# Patient Record
Sex: Female | Born: 1935 | Race: White | Hispanic: No | Marital: Married | Smoking: Never smoker
Health system: Southern US, Community
[De-identification: ages and names within clinical notes are randomized; demographics above are authoritative.]

## PROBLEM LIST (undated history)

## (undated) DIAGNOSIS — I509 Heart failure, unspecified: Secondary | ICD-10-CM

## (undated) DIAGNOSIS — E559 Vitamin D deficiency, unspecified: Secondary | ICD-10-CM

## (undated) DIAGNOSIS — I10 Essential (primary) hypertension: Secondary | ICD-10-CM

## (undated) DIAGNOSIS — M1990 Unspecified osteoarthritis, unspecified site: Secondary | ICD-10-CM

## (undated) DIAGNOSIS — K219 Gastro-esophageal reflux disease without esophagitis: Secondary | ICD-10-CM

## (undated) DIAGNOSIS — G309 Alzheimer's disease, unspecified: Secondary | ICD-10-CM

## (undated) DIAGNOSIS — F0280 Dementia in other diseases classified elsewhere without behavioral disturbance: Secondary | ICD-10-CM

## (undated) DIAGNOSIS — I639 Cerebral infarction, unspecified: Secondary | ICD-10-CM

## (undated) DIAGNOSIS — K5900 Constipation, unspecified: Secondary | ICD-10-CM

## (undated) HISTORY — PX: ABDOMINAL HYSTERECTOMY: SHX81

## (undated) HISTORY — PX: BYPASS GRAFT: SHX909

---

## 1998-09-27 ENCOUNTER — Other Ambulatory Visit: Admission: RE | Admit: 1998-09-27 | Discharge: 1998-09-27 | Payer: Self-pay | Admitting: Obstetrics and Gynecology

## 1998-12-27 ENCOUNTER — Ambulatory Visit (HOSPITAL_COMMUNITY): Admission: RE | Admit: 1998-12-27 | Discharge: 1998-12-28 | Payer: Self-pay | Admitting: Cardiovascular Disease

## 1999-01-04 ENCOUNTER — Encounter: Payer: Self-pay | Admitting: Thoracic Surgery (Cardiothoracic Vascular Surgery)

## 1999-01-06 ENCOUNTER — Inpatient Hospital Stay (HOSPITAL_COMMUNITY)
Admission: RE | Admit: 1999-01-06 | Discharge: 1999-01-12 | Payer: Self-pay | Admitting: Thoracic Surgery (Cardiothoracic Vascular Surgery)

## 1999-01-06 ENCOUNTER — Encounter: Payer: Self-pay | Admitting: Thoracic Surgery (Cardiothoracic Vascular Surgery)

## 1999-01-07 ENCOUNTER — Encounter: Payer: Self-pay | Admitting: Thoracic Surgery (Cardiothoracic Vascular Surgery)

## 1999-01-08 ENCOUNTER — Encounter: Payer: Self-pay | Admitting: Thoracic Surgery (Cardiothoracic Vascular Surgery)

## 1999-01-09 ENCOUNTER — Encounter: Payer: Self-pay | Admitting: Thoracic Surgery (Cardiothoracic Vascular Surgery)

## 1999-07-06 ENCOUNTER — Inpatient Hospital Stay (HOSPITAL_COMMUNITY): Admission: AD | Admit: 1999-07-06 | Discharge: 1999-07-08 | Payer: Self-pay | Admitting: Cardiovascular Disease

## 1999-07-06 ENCOUNTER — Encounter: Payer: Self-pay | Admitting: Cardiovascular Disease

## 2001-01-25 ENCOUNTER — Ambulatory Visit (HOSPITAL_COMMUNITY): Admission: RE | Admit: 2001-01-25 | Discharge: 2001-01-25 | Payer: Self-pay | Admitting: Family Medicine

## 2001-01-25 ENCOUNTER — Encounter: Payer: Self-pay | Admitting: Family Medicine

## 2001-02-27 ENCOUNTER — Other Ambulatory Visit: Admission: RE | Admit: 2001-02-27 | Discharge: 2001-02-27 | Payer: Self-pay | Admitting: Obstetrics and Gynecology

## 2002-03-10 ENCOUNTER — Other Ambulatory Visit: Admission: RE | Admit: 2002-03-10 | Discharge: 2002-03-10 | Payer: Self-pay | Admitting: Obstetrics and Gynecology

## 2003-08-04 ENCOUNTER — Other Ambulatory Visit: Admission: RE | Admit: 2003-08-04 | Discharge: 2003-08-04 | Payer: Self-pay | Admitting: Obstetrics and Gynecology

## 2005-11-15 ENCOUNTER — Encounter: Admission: RE | Admit: 2005-11-15 | Discharge: 2006-02-13 | Payer: Self-pay | Admitting: Family Medicine

## 2010-06-12 ENCOUNTER — Other Ambulatory Visit: Payer: Self-pay

## 2010-09-12 ENCOUNTER — Ambulatory Visit: Payer: Self-pay | Admitting: Emergency Medicine

## 2013-05-01 ENCOUNTER — Ambulatory Visit (INDEPENDENT_AMBULATORY_CARE_PROVIDER_SITE_OTHER): Payer: Medicare Other | Admitting: Podiatrist

## 2013-05-01 ENCOUNTER — Encounter: Payer: Self-pay | Admitting: Podiatrist

## 2013-05-01 VITALS — BP 100/41 | HR 59 | Resp 16 | Ht 63.0 in | Wt 150.0 lb

## 2013-05-01 DIAGNOSIS — M79609 Pain in unspecified limb: Secondary | ICD-10-CM

## 2013-05-01 DIAGNOSIS — B351 Tinea unguium: Secondary | ICD-10-CM

## 2013-05-01 NOTE — Progress Notes (Signed)
   Subjective:    Patient ID: Marissa Colon, female    DOB: [birth year], 80 y.o.   MRN: [id number]  HPI Comments: N pain  L trim toenails/corns D yrs O slowly C worse A growing out T pts nurse tried to cut toenails, corn pads     Review of Systems     Objective:   Physical Exam GENERAL APPEARANCE: Alert, conversant. Appropriately groomed. No acute distress.  VASCULAR: Pedal pulses palpable at 1/4 DP and PT bilateral.  Capillary refill time is immediate to all digits,  Proximal to distal cooling it warm to warm.   NEUROLOGIC: sensation is intact epicritically and protectively to 5.07 monofilament at 5/5 sites bilateral.  Light touch is intact bilateral, vibratory sensation intact bilateral,  MUSCULOSKELETAL: bunion deformity, flat feet, digital contractures with crowding of toes is present bilateral.  DERMATOLOGIC: toenails are thick, discolored, dystrophic and clinically mycotic 1-5 bilateral.  Hard corns present dorsal 4th toes bilateral of which her caregivers have been placing salicylic acid corn removers on these lesions.      Assessment & Plan:  Symptomatic onychomycosis Plan:  Discussed etiology, pathology, conservative vs. Surgical therapies and at this time debridement of symptomatic toenails was recommended.  Onychoreduction of symptomatic toenails was performed without iatrogenic incident.  Patient was instructed on signs and symptoms of infection and was told to call immediately should any of these arise.  Advised against salicylic acid corn treatment.  Patient's son requested no trim of the corns therefore these were left alone although the patient asked they be trimmed.

## 2013-05-01 NOTE — Progress Notes (Deleted)
Toenails, bunions, corns 4th toes bilateral, crowding of digits 1/4 dp pt, neuro ok, memory care, son doesn't want corns cut on- sal acid tx

## 2016-03-27 ENCOUNTER — Ambulatory Visit (INDEPENDENT_AMBULATORY_CARE_PROVIDER_SITE_OTHER): Payer: Medicare Other | Admitting: Podiatry

## 2016-03-27 ENCOUNTER — Encounter: Payer: Self-pay | Admitting: Podiatry

## 2016-03-27 DIAGNOSIS — M79676 Pain in unspecified toe(s): Secondary | ICD-10-CM

## 2016-03-27 DIAGNOSIS — B351 Tinea unguium: Secondary | ICD-10-CM

## 2016-03-27 NOTE — Progress Notes (Signed)
   Subjective:    Patient ID: Marissa Colon, female    DOB: October 16, 1923, 83 y.o.   MRN: [id number]  HPI's patient presents the office with chief complaint of long painful nails. She says the nails are painful when she walks and wears her shoes. Her nails have not been treated for a long period of time. No evidence of any drainage or infection noted. Resents the office today for preventative foot care services    Review of Systems     Objective:   Physical Exam GENERAL APPEARANCE: Alert, conversant. Appropriately groomed. No acute distress.  VASCULAR: Pedal pulses are  Not  palpable at  [facility].  Capillary refill time is immediate to all digits,  Cold feet noted.  NEUROLOGIC: sensation is normal to 5.07 monofilament at 5/5 sites bilateral.  Light touch is intact bilateral, Muscle strength normal.  MUSCULOSKELETAL: acceptable muscle strength, tone and stability bilateral.  Intrinsic muscluature intact bilateral.  Rectus appearance of foot and digits noted bilateral. Swelling feet/legs  B/L  DERMATOLOGIC: skin color, texture, and turgor are within normal limits.  No preulcerative lesions or ulcers  are seen, no interdigital maceration noted.  No open lesions present.   No drainage noted.  NAILS  Thick ling disfigured discolored nails  B/L           Assessment & Plan:  Onychomycosis  B/L  IE  Debride nails  RTC 3 months.   Helane GuntherGregory Mayer DPM

## 2016-06-26 ENCOUNTER — Ambulatory Visit: Payer: Medicare Other | Admitting: Podiatry

## 2016-07-13 ENCOUNTER — Ambulatory Visit: Payer: Medicare Other | Admitting: Podiatry

## 2016-08-01 ENCOUNTER — Emergency Department: Payer: Medicare Other

## 2016-08-01 ENCOUNTER — Encounter: Payer: Self-pay | Admitting: Emergency Medicine

## 2016-08-01 ENCOUNTER — Inpatient Hospital Stay
Admission: EM | Admit: 2016-08-01 | Discharge: 2016-08-04 | DRG: 086 | Disposition: A | Discharge status: Skilled Nursing Facility | Payer: Medicare Other | Attending: Internal Medicine | Admitting: Internal Medicine

## 2016-08-01 DIAGNOSIS — Z66 Do not resuscitate: Secondary | ICD-10-CM | POA: Diagnosis present

## 2016-08-01 DIAGNOSIS — S62213A Bennett's fracture, unspecified hand, initial encounter for closed fracture: Secondary | ICD-10-CM

## 2016-08-01 DIAGNOSIS — F0280 Dementia in other diseases classified elsewhere without behavioral disturbance: Secondary | ICD-10-CM | POA: Diagnosis present

## 2016-08-01 DIAGNOSIS — S62212A Bennett's fracture, left hand, initial encounter for closed fracture: Secondary | ICD-10-CM | POA: Diagnosis present

## 2016-08-01 DIAGNOSIS — Z9181 History of falling: Secondary | ICD-10-CM | POA: Diagnosis not present

## 2016-08-01 DIAGNOSIS — G309 Alzheimer's disease, unspecified: Secondary | ICD-10-CM | POA: Diagnosis present

## 2016-08-01 DIAGNOSIS — D72829 Elevated white blood cell count, unspecified: Secondary | ICD-10-CM

## 2016-08-01 DIAGNOSIS — S0990XA Unspecified injury of head, initial encounter: Secondary | ICD-10-CM | POA: Diagnosis present

## 2016-08-01 DIAGNOSIS — Z90710 Acquired absence of both cervix and uterus: Secondary | ICD-10-CM | POA: Diagnosis not present

## 2016-08-01 DIAGNOSIS — N289 Disorder of kidney and ureter, unspecified: Secondary | ICD-10-CM | POA: Diagnosis present

## 2016-08-01 DIAGNOSIS — Z7982 Long term (current) use of aspirin: Secondary | ICD-10-CM | POA: Diagnosis not present

## 2016-08-01 DIAGNOSIS — I6200 Nontraumatic subdural hemorrhage, unspecified: Secondary | ICD-10-CM | POA: Diagnosis not present

## 2016-08-01 DIAGNOSIS — I4581 Long QT syndrome: Secondary | ICD-10-CM | POA: Diagnosis present

## 2016-08-01 DIAGNOSIS — R509 Fever, unspecified: Secondary | ICD-10-CM

## 2016-08-01 DIAGNOSIS — I10 Essential (primary) hypertension: Secondary | ICD-10-CM | POA: Diagnosis present

## 2016-08-01 DIAGNOSIS — N390 Urinary tract infection, site not specified: Secondary | ICD-10-CM | POA: Diagnosis present

## 2016-08-01 DIAGNOSIS — R739 Hyperglycemia, unspecified: Secondary | ICD-10-CM | POA: Diagnosis present

## 2016-08-01 DIAGNOSIS — W1830XA Fall on same level, unspecified, initial encounter: Secondary | ICD-10-CM | POA: Diagnosis present

## 2016-08-01 DIAGNOSIS — D649 Anemia, unspecified: Secondary | ICD-10-CM

## 2016-08-01 DIAGNOSIS — S065X9A Traumatic subdural hemorrhage with loss of consciousness of unspecified duration, initial encounter: Secondary | ICD-10-CM

## 2016-08-01 DIAGNOSIS — E559 Vitamin D deficiency, unspecified: Secondary | ICD-10-CM | POA: Diagnosis present

## 2016-08-01 DIAGNOSIS — Z8673 Personal history of transient ischemic attack (TIA), and cerebral infarction without residual deficits: Secondary | ICD-10-CM | POA: Diagnosis not present

## 2016-08-01 DIAGNOSIS — S065X1A Traumatic subdural hemorrhage with loss of consciousness of 30 minutes or less, initial encounter: Secondary | ICD-10-CM | POA: Diagnosis present

## 2016-08-01 DIAGNOSIS — K5900 Constipation, unspecified: Secondary | ICD-10-CM | POA: Diagnosis present

## 2016-08-01 DIAGNOSIS — Z7902 Long term (current) use of antithrombotics/antiplatelets: Secondary | ICD-10-CM

## 2016-08-01 DIAGNOSIS — D62 Acute posthemorrhagic anemia: Secondary | ICD-10-CM | POA: Diagnosis present

## 2016-08-01 DIAGNOSIS — I629 Nontraumatic intracranial hemorrhage, unspecified: Secondary | ICD-10-CM | POA: Diagnosis not present

## 2016-08-01 DIAGNOSIS — K219 Gastro-esophageal reflux disease without esophagitis: Secondary | ICD-10-CM | POA: Diagnosis present

## 2016-08-01 DIAGNOSIS — S065XAA Traumatic subdural hemorrhage with loss of consciousness status unknown, initial encounter: Secondary | ICD-10-CM

## 2016-08-01 DIAGNOSIS — S0231XA Fracture of orbital floor, right side, initial encounter for closed fracture: Secondary | ICD-10-CM | POA: Diagnosis present

## 2016-08-01 HISTORY — DX: Vitamin D deficiency, unspecified: E55.9

## 2016-08-01 HISTORY — DX: Alzheimer's disease, unspecified: G30.9

## 2016-08-01 HISTORY — DX: Essential (primary) hypertension: I10

## 2016-08-01 HISTORY — DX: Heart failure, unspecified: I50.9

## 2016-08-01 HISTORY — DX: Gastro-esophageal reflux disease without esophagitis: K21.9

## 2016-08-01 HISTORY — DX: Constipation, unspecified: K59.00

## 2016-08-01 HISTORY — DX: Cerebral infarction, unspecified: I63.9

## 2016-08-01 HISTORY — DX: Unspecified osteoarthritis, unspecified site: M19.90

## 2016-08-01 HISTORY — DX: Dementia in other diseases classified elsewhere, unspecified severity, without behavioral disturbance, psychotic disturbance, mood disturbance, and anxiety: F02.80

## 2016-08-01 LAB — BASIC METABOLIC PANEL
Anion gap: 10 (ref 5–15)
BUN: 18 mg/dL (ref 6–20)
CHLORIDE: 101 mmol/L (ref 101–111)
CO2: 27 mmol/L (ref 22–32)
CREATININE: 1.04 mg/dL — AB (ref 0.44–1.00)
Calcium: 9.6 mg/dL (ref 8.9–10.3)
GFR calc Af Amer: 53 mL/min — ABNORMAL LOW (ref >60)
GFR calc non Af Amer: 46 mL/min — ABNORMAL LOW (ref >60)
GLUCOSE: 146 mg/dL — AB (ref 65–99)
Potassium: 3.5 mmol/L (ref 3.5–5.1)
Sodium: 138 mmol/L (ref 135–145)

## 2016-08-01 LAB — CBC WITH DIFFERENTIAL/PLATELET
Basophils Absolute: 0 10*3/uL (ref 0–0.1)
Basophils Relative: 0 %
Eosinophils Absolute: 0 10*3/uL (ref 0–0.7)
Eosinophils Relative: 0 %
HEMATOCRIT: 38.7 % (ref 35.0–47.0)
HEMOGLOBIN: 12.8 g/dL (ref 12.0–16.0)
LYMPHS ABS: 0.8 10*3/uL — AB (ref 1.0–3.6)
Lymphocytes Relative: 5 %
MCH: 31.2 pg (ref 26.0–34.0)
MCHC: 33.2 g/dL (ref 32.0–36.0)
MCV: 94.1 fL (ref 80.0–100.0)
MONOS PCT: 5 %
Monocytes Absolute: 0.9 10*3/uL (ref 0.2–0.9)
NEUTROS ABS: 14.9 10*3/uL — AB (ref 1.4–6.5)
NEUTROS PCT: 90 %
Platelets: 213 10*3/uL (ref 150–440)
RBC: 4.11 MIL/uL (ref 3.80–5.20)
RDW: 14.1 % (ref 11.5–14.5)
WBC: 16.6 10*3/uL — ABNORMAL HIGH (ref 3.6–11.0)

## 2016-08-01 LAB — TROPONIN I: Troponin I: <0.03 ng/mL (ref <0.03)

## 2016-08-01 LAB — ABO/RH: ABO/RH(D): O POS

## 2016-08-01 LAB — MRSA PCR SCREENING: MRSA BY PCR: NEGATIVE

## 2016-08-01 MED ORDER — LISINOPRIL 10 MG PO TABS
10.0000 mg | ORAL_TABLET | Freq: Every day | ORAL | Status: DC
  Administered 2016-08-01 – 2016-08-03 (×3): 10 mg via ORAL
  Filled 2016-08-01 (×4): qty 1

## 2016-08-01 MED ORDER — VITAMIN D (ERGOCALCIFEROL) 1.25 MG (50000 UNIT) PO CAPS
50000.0000 [IU] | ORAL_CAPSULE | ORAL | Status: DC
  Administered 2016-08-04: 10:00:00 50000 [IU] via ORAL
  Filled 2016-08-01: qty 1

## 2016-08-01 MED ORDER — NITROGLYCERIN 0.4 MG SL SUBL: 0.4000 mg | SUBLINGUAL_TABLET | SUBLINGUAL | Status: DC | PRN

## 2016-08-01 MED ORDER — DONEPEZIL HCL 5 MG PO TABS
10.0000 mg | ORAL_TABLET | Freq: Every day | ORAL | Status: DC
  Administered 2016-08-02 – 2016-08-04 (×3): 10 mg via ORAL
  Filled 2016-08-01 (×3): qty 2

## 2016-08-01 MED ORDER — METOPROLOL SUCCINATE ER 50 MG PO TB24
50.0000 mg | ORAL_TABLET | Freq: Every day | ORAL | Status: DC
  Administered 2016-08-02 – 2016-08-04 (×3): 50 mg via ORAL
  Filled 2016-08-01 (×3): qty 1

## 2016-08-01 MED ORDER — POLYETHYLENE GLYCOL 3350 17 G PO PACK
17.0000 g | PACK | Freq: Every day | ORAL | Status: DC | PRN
  Filled 2016-08-01: qty 1

## 2016-08-01 MED ORDER — FAMOTIDINE 20 MG PO TABS
20.0000 mg | ORAL_TABLET | Freq: Every day | ORAL | Status: DC
  Administered 2016-08-02 – 2016-08-04 (×3): 20 mg via ORAL
  Filled 2016-08-01 (×3): qty 1

## 2016-08-01 MED ORDER — MEMANTINE HCL ER 14 MG PO CP24
14.0000 mg | ORAL_CAPSULE | Freq: Every day | ORAL | Status: DC
  Administered 2016-08-02 – 2016-08-04 (×3): 14 mg via ORAL
  Filled 2016-08-01 (×3): qty 1

## 2016-08-01 MED ORDER — LACTASE 3000 UNITS PO TABS
6000.0000 [IU] | ORAL_TABLET | Freq: Three times a day (TID) | ORAL | Status: DC
  Administered 2016-08-02 – 2016-08-04 (×7): 6000 [IU] via ORAL
  Filled 2016-08-01 (×10): qty 2

## 2016-08-01 MED ORDER — ONDANSETRON HCL 4 MG PO TABS
4.0000 mg | ORAL_TABLET | Freq: Four times a day (QID) | ORAL | Status: DC | PRN
  Filled 2016-08-01: qty 1

## 2016-08-01 MED ORDER — SODIUM CHLORIDE 0.9 % IV SOLN
0.3000 ug/kg | Freq: Once | INTRAVENOUS | Status: AC
  Administered 2016-08-01: 18.4 ug via INTRAVENOUS
  Filled 2016-08-01: qty 4.6

## 2016-08-01 MED ORDER — AMLODIPINE BESYLATE 5 MG PO TABS: 5.0000 mg | ORAL_TABLET | Freq: Every day | ORAL | Status: DC

## 2016-08-01 MED ORDER — ONDANSETRON HCL 4 MG/2ML IJ SOLN
4.0000 mg | Freq: Four times a day (QID) | INTRAMUSCULAR | Status: DC | PRN
  Filled 2016-08-01: qty 2

## 2016-08-01 MED ORDER — ADULT MULTIVITAMIN W/MINERALS CH
1.0000 | ORAL_TABLET | Freq: Every day | ORAL | Status: DC
  Administered 2016-08-02 – 2016-08-04 (×3): 1 via ORAL
  Filled 2016-08-01 (×3): qty 1

## 2016-08-01 MED ORDER — CITALOPRAM HYDROBROMIDE 20 MG PO TABS
40.0000 mg | ORAL_TABLET | Freq: Every day | ORAL | Status: DC
  Administered 2016-08-02 – 2016-08-04 (×3): 40 mg via ORAL
  Filled 2016-08-01 (×3): qty 2

## 2016-08-01 MED ORDER — VITAMIN C 500 MG PO TABS: 500.0000 mg | ORAL_TABLET | Freq: Every day | ORAL | Status: DC

## 2016-08-01 MED ORDER — ACETAMINOPHEN 500 MG PO TABS
500.0000 mg | ORAL_TABLET | Freq: Four times a day (QID) | ORAL | Status: DC | PRN
  Administered 2016-08-02: 20:00:00 500 mg via ORAL
  Filled 2016-08-01 (×2): qty 1

## 2016-08-01 NOTE — ED Notes (Signed)
Ice applied to right eye.

## 2016-08-01 NOTE — ED Notes (Signed)
Patient transported to CT 

## 2016-08-01 NOTE — Progress Notes (Signed)
Family Meeting Note  Advance Directive:yes  Today a meeting took place with the Patient's son in the patient's room  Patient is unable to participate due NW:GNFAOZto:Lacked capacity Alzheimer's dementia   The following clinical team members were present during this meeting:MD  The following were discussed:Patient's diagnosis: Plan of care discussed with the patient's son at bedside , Patient's progosis: Unable to determine and Goals for treatment: Monitor patient closely, son might consider palliative care if patient is not clinically improving  Additional follow-up to be provided: Hospitalist and neurosurgery  Time spent during discussion:19 min  Marissa Colon, Deanna ArtisAruna, MD

## 2016-08-01 NOTE — Consult Note (Signed)
Neurosurgery-New Consultation Evaluation 08/01/2016 Marissa Colon [id number]  Identifying Statement: Marissa Colon is a 83 y.o. female from [place] [address] with fall and subdural hematoma  Physician Requesting Consultation: Willy Eddy, MD - Emergency Department  History of Present Illness: Marissa Colon is admitted from her living facility after fall and injury to head. She was seen to have bruising to the right side of her face. Per son, she lives at facility due to history of Alzheimer's and has had some mechanical falls. She is on Aspirin and Plavix due to history of stroke, previously on Coumadin. She was brought alert to the ED and had no major complaints. There is unknown LOC. CT of the head in the ED revealed an acute right temporal SDH and some small amount of SAH/IVH.   She has an active DNAR and son is Management consultant.   Past Medical History:  Past Medical History:  Diagnosis Date  . Alzheimer's disease   . Alzheimer's disease   . Avitaminosis D   . CN (constipation)   . Esophageal reflux   . Essential hypertension, malignant   . Heart failure, unspecified (HCC)   . Senile arthritis   . 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 Marital status: Married    Spouse name: N/A  . Number of children: N/A  . Years of education: N/A   Occupational History  . Not on file.   Social History Main Topics  . Smoking status: Never Smoker  . Smokeless tobacco: Never Used  . Alcohol use No  . Drug use: No  . Sexual activity: Not on file   Other Topics Concern  . Not on file   Social History Narrative  . No narrative on file     Family History: History reviewed. No pertinent family history.  Review of Systems:  Review of Systems - General ROS: Negative Psychological ROS: Negative Ophthalmic ROS: Negative ENT ROS: Positive for right orbital bruising/swelling Hematological and Lymphatic ROS: Negative   Endocrine ROS: Negative Respiratory ROS: Negative Cardiovascular ROS: Negative Gastrointestinal ROS: Negative Genito-Urinary ROS: Negative Musculoskeletal ROS: Negative Neurological ROS: Negative for seizure, headache Dermatological ROS: Negative  Physical Exam: BP (!) 152/66 (BP Location: Right Arm)   Pulse 79   Temp 99.7 F (37.6 C) (Oral)   Resp 18   Ht 5\' 2"  (1.575 m)   Wt 68.5 kg (151 lb)   LMP  (LMP Unknown)   SpO2 90%   BMI 27.62 kg/m  Body mass index is 27.62 kg/m. Body surface area is 1.73 meters squared. General appearance: Alert, cooperative Head: Normocephalic, There is bruising noted around right orbit that results in swelling and closure of eye Eyes: Normal, EOM intact in left eye, unable to test right eye Oropharynx: Moist without lesions Ext: No edema in LE bilaterally  Neurologic exam:  Mental status: alertness: alert, affect: appears comfortable Speech: fluent and clear Cranial nerves:  II: unable to test visual Masterson given mental status III/IV/VI: extra-ocular motions intact in left eye V/VII:no evidence of facial droop or weakness  VIII: hearing normal XII: tongue strength symmetric  Motor:strength symmetric 5/5, normal muscle mass and tone in all extremities Sensory: intact to light touch in all extremities Gait: not tested due to injury  Laboratory: Results for orders placed or performed during the hospital encounter of 08/01/16  MRSA PCR Screening  Result Value Ref Range   MRSA by PCR NEGATIVE NEGATIVE  CBC with Differential/Platelet  Result Value Ref Range   WBC 16.6 (H) 3.6 - 11.0 K/uL   RBC 4.11 3.80 - 5.20 MIL/uL   Hemoglobin 12.8 12.0 - 16.0 g/dL   HCT 96.038.7 45.435.0 - 09.847.0 %   MCV 94.1 80.0 - 100.0 fL   MCH 31.2 26.0 - 34.0 pg   MCHC 33.2 32.0 - 36.0 g/dL   RDW 11.914.1 14.711.5 - 82.914.5 %   Platelets 213 150 - 440 K/uL   Neutrophils Relative % 90 %   Neutro Abs 14.9 (H) 1.4 - 6.5 K/uL   Lymphocytes Relative 5 %   Lymphs Abs 0.8 (L) 1.0 -  3.6 K/uL   Monocytes Relative 5 %   Monocytes Absolute 0.9 0.2 - 0.9 K/uL   Eosinophils Relative 0 %   Eosinophils Absolute 0.0 0 - 0.7 K/uL   Basophils Relative 0 %   Basophils Absolute 0.0 0 - 0.1 K/uL  Basic metabolic panel  Result Value Ref Range   Sodium 138 135 - 145 mmol/L   Potassium 3.5 3.5 - 5.1 mmol/L   Chloride 101 101 - 111 mmol/L   CO2 27 22 - 32 mmol/L   Glucose, Bld 146 (H) 65 - 99 mg/dL   BUN 18 6 - 20 mg/dL   Creatinine, Ser 5.621.04 (H) 0.44 - 1.00 mg/dL   Calcium 9.6 8.9 - 13.010.3 mg/dL   GFR calc non Af Amer 46 (L) >60 mL/min   GFR calc Af Amer 53 (L) >60 mL/min   Anion gap 10 5 - 15  Troponin I  Result Value Ref Range   Troponin I <0.03 <0.03 ng/mL  Prepare pheresed platelets  Result Value Ref Range   Unit Number [id number]    Blood Component Type PLTP LR2 PAS    Unit division 00    Status of Unit ISSUED    Transfusion Status OK TO TRANSFUSE   ABO/Rh  Result Value Ref Range   ABO/RH(D) O POS   BPAM Platelet Pheresis  Result Value Ref Range   ISSUE DATE / TIME [id number]    Blood Product Unit Number [id number]    PRODUCT CODE [id number]    Unit Type and Rh 5100    Blood Product Expiration Date [id number]    I personally reviewed labs  Imaging:  CT head: There is an acute right temporal extra-axial hemorrhage approximately 7mm in thickness. Given age appropriate atrophy, there is no mass effect. There is small amount of IVH and SAH noted. There is no overlying fracture seen.    Impression/Plan:   Marissa Colon is admitted after having sustained an acute right temporal SDH in a fall. Given her ASA and Plavix use, platelet transfusion is recommended for reversal. The size is small but risk of propagation exists and discussion wa shad with son regarding her DNAR status. We have discussed that medical management of this is appropriate as her age and health status would make any surgical intervention a high risk procedure.     1.  Diagnosis: Acute  right temporal SDH  2.  Plan - Recommend platelet transfusion for ASA use - Can repeat scan in 6-8 hours to view progression - No neurosurgical intervention given co-morbidities and DNAR status, confirmed with son

## 2016-08-01 NOTE — H&P (Addendum)
[facility] [place] at [facility]   PATIENT NAME: Marissa Colon    MR#:  [id number]  DATE OF BIRTH:  11-21-1923  DATE OF ADMISSION:  08/01/2016  PRIMARY CARE PHYSICIAN: Patient, No Pcp Per   REQUESTING/REFERRING PHYSICIAN: Dr. Roxan Hockey  CHIEF COMPLAINT:   Fall HISTORY OF PRESENT ILLNESS:  Marissa Colon  is a 83 y.o. female with a known history of Alzheimer's disease, essential hypertension and other medical problems  is brought into the is brought into the ED after she sustained a fall. Patient had one episode of nausea. Patient hit her head and subsequently she has lost consciousness. In the ED she has right-sided black eye . CT head has revealed intracranial bleed with no midline shift . Patient's son has requested to admit the patient here as she is DO NOT RESUSCITATE and not considering any surgery   ED physician has discussed with neurosurgery. Patient has received 1 dose of DDAVP  PAST MEDICAL HISTORY:   Past Medical History:  Diagnosis Date  . Alzheimer's disease   . Alzheimer's disease   . Avitaminosis D   . CN (constipation)   . Esophageal reflux   . Essential hypertension, malignant   . Heart failure, unspecified (HCC)   . Senile arthritis   . 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 ABDOMINAL HYSTERECTOMY    . BYPASS GRAFT      SOCIAL HISTORY:   Social History  Substance Use Topics  . Smoking status: Never Smoker  . Smokeless tobacco: Never Used  . Alcohol use No    FAMILY HISTORY:  History reviewed. No pertinent family history.  DRUG ALLERGIES:  No Known Allergies  REVIEW OF SYSTEMS:  Review of system unobtainable  MEDICATIONS AT HOME:   Prior to Admission medications   Medication Sig Start Date End Date Taking? Authorizing Provider  acetaminophen (TYLENOL) 500 MG tablet Take 500 mg by mouth every 6 (six) hours as needed.   Yes [provider]  amLODipine (NORVASC) 5 MG tablet Take 5 mg by mouth daily.   Yes [provider]  aspirin 81 MG tablet Take 81 mg by mouth daily.   Yes [provider]  bismuth subsalicylate (KAOPECTATE) 262 MG/15ML suspension Take 30 mLs by mouth every 4 (four) hours as needed.   Yes [provider]  citalopram (CELEXA) 40 MG tablet Take 40 mg by mouth daily.   Yes [provider]  clopidogrel (PLAVIX) 75 MG tablet Take 75 mg by mouth daily with breakfast.   Yes [provider]  CRANBERRY PO Take 1 tablet by mouth 2 (two) times daily.   Yes [provider]  donepezil (ARICEPT) 10 MG tablet Take 10 mg by mouth daily.    Yes [provider]  ergocalciferol (VITAMIN D2) 50000 units capsule Take 50,000 Units by mouth once a week.   Yes [provider]  lactase (LACTAID) 3000 units tablet Take 6,000 Units by mouth 3 (three) times daily with meals.   Yes [provider]  lisinopril (PRINIVIL,ZESTRIL) 10 MG tablet Take 10 mg by mouth at bedtime.   Yes [provider]  memantine (NAMENDA XR) 14 MG CP24 24 hr capsule Take 14 mg by mouth daily.   Yes [provider]  metoprolol succinate (TOPROL-XL) 50 MG 24 hr tablet Take 50 mg by mouth daily. Take with or immediately following a meal.  Yes [provider]  Multiple Vitamin (MULTIVITAMIN WITH MINERALS) TABS tablet Take 1 tablet by mouth daily.   Yes [provider]  naproxen (NAPROSYN) 500 MG tablet Take 500 mg by mouth 2 (two) times daily as needed.   Yes [provider]  nitroGLYCERIN (NITROSTAT) 0.4 MG SL tablet Place 0.4 mg under the tongue every 5 (five) minutes as needed for chest pain.   Yes [provider]  polyethylene glycol (MIRALAX / GLYCOLAX) packet Take 17 g by mouth daily as needed.    Yes [provider]  ranitidine (ZANTAC) 150 MG tablet Take 150 mg by mouth daily.   Yes [provider]   vitamin C (ASCORBIC ACID) 500 MG tablet Take 500 mg by mouth daily.   Yes [provider]      VITAL SIGNS:  Blood pressure (!) 120/57, pulse 79, temperature 98.2 F (36.8 C), temperature source Oral, resp. rate 17, height 5\' 2"  (1.575 m), weight 61.2 kg (135 lb), SpO2 96 %.  PHYSICAL EXAMINATION:  GENERAL:  83 y.o.-year-old patient lying in the bed with no acute distress.  EYES: Left eye Pupils equal, round, reactive to light and accommodation. No scleral icterus.  Rt black eye HEENT: Head atraumatic, Normocephalic. Oropharynx and nasopharynx clear.  NECK:  Supple, no jugular venous distention. No thyroid enlargement, no tenderness.  LUNGS: Normal breath sounds bilaterally, no wheezing, rales,rhonchi or crepitation. No use of accessory muscles of respiration.  CARDIOVASCULAR: S1, S2 normal. No murmurs, rubs, or gallops.  ABDOMEN: Soft, nontender, nondistended. Bowel sounds present. No organomegaly or mass.  EXTREMITIES: No pedal edema, cyanosis, or clubbing.  NEUROLOGIC: Patient is chronically demented   PSYCHIATRIC: The patient is alert and demented  SKIN: No obvious rash, lesion, or ulcer.   LABORATORY PANEL:   CBC  Recent Labs Lab 08/01/16 1306  WBC 16.6*  HGB 12.8  HCT 38.7  PLT 213   ------------------------------------------------------------------------------------------------------------------  Chemistries   Recent Labs Lab 08/01/16 1306  NA 138  K 3.5  CL 101  CO2 27  GLUCOSE 146*  BUN 18  CREATININE 1.04*  CALCIUM 9.6   ------------------------------------------------------------------------------------------------------------------  Cardiac Enzymes  Recent Labs Lab 08/01/16 1306  TROPONINI <0.03   ------------------------------------------------------------------------------------------------------------------  RADIOLOGY:  Dg Chest 2 View  Result Date: 08/01/2016 CLINICAL DATA:  Fall. EXAM: CHEST  2 VIEW COMPARISON:  Chest x-ray  07/06/1999 report . FINDINGS: Prior CABG. Cardiomegaly with mild bilateral interstitial prominence consistent with CHF. No pleural effusion or pneumothorax. Degenerative changes and osteopenia thoracic spine. Mild lower thoracic vertebral body compression fractures, age undetermined. IMPRESSION: Prior CABG. Cardiomegaly with diffuse mild bilateral interstitial prominence consistent with mild CHF. Electronically Signed   By: Maisie Fus  Register   On: 08/01/2016 13:07   Ct Head Wo Contrast  Result Date: 08/01/2016 CLINICAL DATA:  Pt to ED via EMS from [facility] for a fall this morning, EMS states per facility it was a mechanical witnessed fall and then had a vagal episode on the toilet. EXAM: CT HEAD WITHOUT CONTRAST CT MAXILLOFACIAL WITHOUT CONTRAST TECHNIQUE: Multidetector CT imaging of the head and maxillofacial structures were performed using the standard protocol without intravenous contrast. Multiplanar CT image reconstructions of the maxillofacial structures were also generated. COMPARISON:  None FINDINGS: CT HEAD FINDINGS Brain: There is intracranial hemorrhage. There is a small right temporal subdural hematoma measuring 6.5 mm in thickness. Small foci of hemorrhage are seen along the cortical margins of the frontal lobes anteriorly, consistent with parenchymal contusions. There is intraventricular hemorrhage.  Hemorrhage is seen layering in the dependent portions of the lateral ventricle occipital horns and right temporal horn. There is hemorrhage within the right lateral ventricle anterior horn hand adjacent to the right septum pellucidum. A tiny focus of hemorrhage is seen along the anterior interhemispheric fissure. The ventricles are enlarged along the sulci reflecting moderate generalized atrophy. No convincing hydrocephalus. There is no old lacune infarct in the left basal ganglia. Patchy white matter hypoattenuation is noted bilaterally consistent with moderate chronic microvascular ischemic change.  There is no evidence of an ischemic infarct. Vascular: No hyperdense vessel or unexpected calcification. Skull: No skull fracture.  No skull lesion. Other: Right periorbital soft tissue hemorrhage described below. CT MAXILLOFACIAL FINDINGS Osseous: Subtle nondisplaced fracture of the lateral right orbital wall, which could be acute or old. Mild depression of the floor of the right orbit which may reflect another acute fracture but could be chronic. No other evidence of a fracture. No bone lesions. Orbits: There is significant right preseptal periorbital hemorrhage extending to the right cheek and right lower forehead and temple region. No postseptal orbital hemorrhage. Globe is intact. Left globe and orbit are unremarkable. Sinuses: Clear sinuses, mastoid air cells and middle ear cavities. Soft tissues: No soft tissue masses or adenopathy. IMPRESSION: 1. Acute intracranial hemorrhage. There are small foci of cortical hemorrhage reflecting contusions along the anterior frontal lobes. There is a small right temporal subdural hematoma and there is a small amount of intraventricular hemorrhage. No mass effect or midline shift. 2. Nondisplaced fracture of the right lateral orbital wall and mildly depressed fracture of right orbital floor, both of which could be acute or chronic. A chronic etiology is supported given the lack of adjacent edema and lack of sinus fluid. No other evidence of a fracture. 3. Significant right preseptal periorbital hemorrhage extending to the right inferior forehead, right temple region right cheek. No evidence of injury to the right globe or postseptal orbit. Critical Value/emergent results were called by telephone at the time of interpretation on 08/01/2016 at 12:56 pm to Dr. Ernestina Columbia, who verbally acknowledged these results. Electronically Signed   By: Amie Portland M.D.   On: 08/01/2016 12:53   Ct Cervical Spine Wo Contrast  Result Date: 08/01/2016 CLINICAL DATA:  Fall EXAM: CT CERVICAL  SPINE WITHOUT CONTRAST TECHNIQUE: Multidetector CT imaging of the cervical spine was performed without intravenous contrast. Multiplanar CT image reconstructions were also generated. COMPARISON:  None. FINDINGS: Alignment: No static subluxation. Facets are aligned. Occipital condyles and the lateral masses of C1 and C2 are normally approximated. Skull base and vertebrae: No acute fracture. Soft tissues and spinal canal: No prevertebral fluid or swelling. No visible canal hematoma. Disc levels: There is multilevel severe facet hypertrophy and disc degeneration with uncovertebral hypertrophy. The left C3-C4 facets are fused. There is severe stenosis of the right C6-7 neural foramen. No bony spinal canal stenosis. Upper chest: No pneumothorax, pulmonary nodule or pleural effusion. There is aortic atherosclerosis. Other: Normal visualized paraspinal cervical soft tissues. IMPRESSION: 1. No acute fracture or static subluxation of the cervical spine. 2. 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 3.  Aortic Atherosclerosis (ICD10-I70.0). Electronically Signed   By: Deatra Robinson M.D.   On: 08/01/2016 13:46   Dg Hand Complete Left  Result Date: 08/01/2016 CLINICAL DATA:  Larey Seat and injured left hand. EXAM: LEFT HAND - COMPLETE 3+ VIEW COMPARISON:  None. FINDINGS: Moderate to advanced degenerative changes involving the DIP and PIP joints of the fingers.  The MCP joints are maintained. The radiocarpal and intercarpal joint spaces are maintained. No wrist fracture is identified. There is a fracture involving the base of the thumb with probable intra-articular component at the [facility]. No associated dislocation. IMPRESSION: Comminuted intra-articular fracture involving the base of the thumb Willeen Cass(Bennett fracture). No associated dislocation. Degenerative changes but no other fractures. Electronically Signed   By: Rudie MeyerP.  Gallerani M.D.   On: 08/01/2016 13:10   Ct Maxillofacial Wo Contrast  Result  Date: 08/01/2016 CLINICAL DATA:  [facility] ED [facility] for [facility] this morning, EMS states per facility it was a mechanical witnessed fall and then had a vagal episode on the toilet. EXAM: CT HEAD WITHOUT CONTRAST CT MAXILLOFACIAL WITHOUT CONTRAST TECHNIQUE: Multidetector CT imaging of the head and maxillofacial structures were performed using the standard protocol without intravenous contrast. Multiplanar CT image reconstructions of the maxillofacial structures were also generated. COMPARISON:  None FINDINGS: CT HEAD FINDINGS Brain: There is intracranial hemorrhage. There is a small right temporal subdural hematoma measuring 6.5 mm in thickness. Small foci of hemorrhage are seen along the cortical margins of the frontal lobes anteriorly, consistent with parenchymal contusions. There is intraventricular hemorrhage. Hemorrhage is seen layering in the dependent portions of the lateral ventricle occipital horns and right temporal horn. There is hemorrhage within the right lateral ventricle anterior horn hand adjacent to the right septum pellucidum. A tiny focus of hemorrhage is seen along the anterior interhemispheric fissure. The ventricles are enlarged along the sulci reflecting moderate generalized atrophy. No convincing hydrocephalus. There is no old lacune infarct in the left basal ganglia. Patchy white matter hypoattenuation is noted bilaterally consistent with moderate chronic microvascular ischemic change. There is no evidence of an ischemic infarct. Vascular: No hyperdense vessel or unexpected calcification. Skull: No skull fracture.  No skull lesion. Other: Right periorbital soft tissue hemorrhage described below. CT MAXILLOFACIAL FINDINGS Osseous: Subtle nondisplaced fracture of the lateral right orbital wall, which could be acute or old. Mild depression of the floor of the right orbit which may reflect another acute fracture but could be chronic. No other evidence of a fracture. No bone lesions.  Orbits: There is significant right preseptal periorbital hemorrhage extending to the right cheek and right lower forehead and temple region. No postseptal orbital hemorrhage. Globe is intact. Left globe and orbit are unremarkable. Sinuses: Clear sinuses, mastoid air cells and middle ear cavities. Soft tissues: No soft tissue masses or adenopathy. IMPRESSION: 1. Acute intracranial hemorrhage. There are small foci of cortical hemorrhage reflecting contusions along the anterior frontal lobes. There is a small right temporal subdural hematoma and there is a small amount of intraventricular hemorrhage. No mass effect or midline shift. 2. Nondisplaced fracture of the right lateral orbital wall and mildly depressed fracture of right orbital floor, both of which could be acute or chronic. A chronic etiology is supported given the lack of adjacent edema and lack of sinus fluid. No other evidence of a fracture. 3. Significant right preseptal periorbital hemorrhage extending to the right inferior forehead, right temple region right cheek. No evidence of injury to the right globe or postseptal orbit. Critical Value/emergent results were called by telephone at the time of interpretation on 08/01/2016 at 12:56 pm to Dr. Ernestina Columbiaifenberk, who verbally acknowledged these results. Electronically Signed   By: Amie Portlandavid  Ormond M.D.   On: 08/01/2016 12:53    EKG:   Orders placed or performed during the hospital encounter of 08/01/16  . EKG 12-Lead  .  EKG 12-Lead    IMPRESSION AND PLAN:   Marissa Colon  is a 83 y.o. female with a known history of Alzheimer's disease, essential hypertension and other medical problems  is brought into the is brought into the ED after she sustained a fall. Patient had one episode of nausea. Patient hit her head and subsequently she has lost consciousness. In the ED she has right-sided black eye . CT head has revealed intracranial bleed with no midline shift . Patient's son has requested to admit the  patient here as she is DO NOT RESUSCITATE and not considering any surgery     #acute intracranial hemorrhage with no midline shift   admit to MedSurg unit she is DO NOT RESUSCITATE Neurosurgery consulted, follow-up with Dr. Adriana Simas 1 unit of platelet transfusion ordered Symptomatic treatment Antinausea medication as needed, supportive treatment Pain management 1 dose of desmopressin was given by the ED physician Hold aspirin and Plavix can other blood thinners  #Right lateral orbital wall fracture nondisplaced Pain management as needed  #Chronic history of Alzheimer's dementia Continue home medications Aricept  #Essential hypertension continue home medication lisinopril, beta blockers  All the records are reviewed and case discussed with ED provider. Management plans discussed with the patient, family and they are in agreement.  CODE STATUS: DNR , son Jonny Ruiz Theall is the healthcare power of attorney   TOTAL critical care TIME TAKING CARE OF THIS PATIENT: 43 minutes.   Note: This dictation was prepared with Dragon dictation along with smaller phrase technology. Any transcriptional errors that result from this process are unintentional.  Ramonita Lab M.D on 08/01/2016 at 4:16 PM  Between 7am to 6pm - Pager - [phone]  After 6pm go to [email or web address] - password [email or web address]  [facility]  Office  [phone]  CC: Primary care physician; Patient, No Pcp Per

## 2016-08-01 NOTE — ED Triage Notes (Signed)
[facility] this [facility], EMS states per facility it was a mechanical witnessed fall and then had a vagal episode on the toilet.  EMS vitals 113/76 BP, 75HR, 94% 4L [place], DNR with patient.  Patient has bruise to right eye, denies any pain.

## 2016-08-01 NOTE — ED Provider Notes (Signed)
[facility] Emergency Department Provider Note    First MD Initiated Contact with Patient 08/01/16 1210     (approximate)  I have reviewed the triage vital signs and the nursing notes.   HISTORY  Chief Complaint Fall  Level V Caveat:  Dementia, TBI  HPI Marissa Colon is a 83 y.o. female who presents after mechanical fall this morning. Patient has a history of Alzheimer's dementia as well as CVA and is on aspirin and Plavix. Fell from ground level. Hit her head but subsequently roughly an hour later had loss of consciousness. Also one episode of nausea.  Patient at this point is pleasant she is confused which is poorly at baseline. Is on 3-4 L nasal cannula chronically. Patient's son is at bedside and states that she is DO NOT RESUSCITATE.   Past Medical History:  Diagnosis Date  . Alzheimer's disease   . Alzheimer's disease   . Avitaminosis D   . CN (constipation)   . Esophageal reflux   . Essential hypertension, malignant   . Heart failure, unspecified (HCC)   . Senile arthritis   . 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 No pertinent family history. Past Surgical History:  Procedure Laterality Date  . ABDOMINAL HYSTERECTOMY    . BYPASS GRAFT     There are no active problems to display for this patient.     Prior to Admission medications   Medication Sig Start Date End Date Taking? Authorizing Provider  acetaminophen (TYLENOL) 500 MG tablet Take 500 mg by mouth every 6 (six) hours as needed.   Yes [provider]  amLODipine (NORVASC) 5 MG tablet Take 5 mg by mouth daily.   Yes [provider]  aspirin 81 MG tablet Take 81 mg by mouth daily.   Yes [provider]  bismuth subsalicylate (KAOPECTATE) 262 MG/15ML suspension Take 30 mLs by mouth every 4 (four) hours as needed.   Yes [provider]  citalopram (CELEXA) 40 MG tablet Take 40 mg by mouth daily.   Yes [provider]    clopidogrel (PLAVIX) 75 MG tablet Take 75 mg by mouth daily with breakfast.   Yes [provider]  CRANBERRY PO Take 1 tablet by mouth 2 (two) times daily.   Yes [provider]  donepezil (ARICEPT) 10 MG tablet Take 10 mg by mouth daily.    Yes [provider]  ergocalciferol (VITAMIN D2) 50000 units capsule Take 50,000 Units by mouth once a week.   Yes [provider]  lactase (LACTAID) 3000 units tablet Take 6,000 Units by mouth 3 (three) times daily with meals.   Yes [provider]  lisinopril (PRINIVIL,ZESTRIL) 10 MG tablet Take 10 mg by mouth at bedtime.   Yes [provider]  memantine (NAMENDA XR) 14 MG CP24 24 hr capsule Take 14 mg by mouth daily.   Yes [provider]  metoprolol succinate (TOPROL-XL) 50 MG 24 hr tablet Take 50 mg by mouth daily. Take with or immediately following a meal.    Yes [provider]  Multiple Vitamin (MULTIVITAMIN WITH MINERALS) TABS tablet Take 1 tablet by mouth daily.   Yes [provider]  naproxen (NAPROSYN) 500 MG tablet Take 500 mg by mouth 2 (two) times daily as needed.   Yes [provider]  nitroGLYCERIN (NITROSTAT) 0.4 MG SL tablet Place 0.4 mg under the tongue every 5 (five) minutes as needed for chest pain.   Yes [provider]  polyethylene glycol (MIRALAX / GLYCOLAX) packet Take 17 g by mouth daily as needed.    Yes [provider]  ranitidine (ZANTAC) 150 MG tablet Take 150 mg by mouth daily.   Yes [provider]  vitamin C (ASCORBIC ACID) 500 MG tablet Take 500 mg by mouth daily.   Yes [provider]    Allergies Patient has no known allergies.    Social History Social History  Substance Use Topics  . Smoking status: Never Smoker  . Smokeless tobacco: Never Used  . Alcohol use No    Review of Systems Patient denies headaches, rhinorrhea, blurry vision, numbness, shortness of breath, chest pain, edema,  cough, abdominal pain, nausea, vomiting, diarrhea, dysuria, fevers, rashes or hallucinations unless otherwise stated above in HPI. ____________________________________________   PHYSICAL EXAM:  VITAL SIGNS: Vitals:   08/01/16 1206 08/01/16 1400  BP: 140/65   Pulse: 72 83  Resp: 16 19  Temp:      Constitutional: Alert and very pleasant Eyes: Conjunctivae are normal.  Head: large right facial ecchymosis no proptosis Nose: No congestion/rhinnorhea. Mouth/Throat: Mucous membranes are moist.   Neck: No stridor. Painless ROM.  Cardiovascular: Normal rate, regular rhythm. Grossly normal heart sounds.  Good peripheral circulation. Respiratory: Normal respiratory effort.  No retractions. Lungs CTAB. Gastrointestinal: Soft and nontender. No distention. No abdominal bruits. No CVA tenderness. Musculoskeletal: No lower extremity tenderness nor edema.  No joint effusions.  + ecchymosis to left thumb Neurologic:  Normal speech and language. No gross focal neurologic deficits are appreciated. No facial droop Skin:  Skin is warm, dry and intact. No rash noted. Psychiatric: Mood and affect are normal. Speech and behavior are normal.  ____________________________________________   LABS (all labs ordered are listed, but only abnormal results are displayed)  Results for orders placed or performed during the hospital encounter of 08/01/16 (from the past 24 hour(s))  CBC with Differential/Platelet     Status: Abnormal   Collection Time: 08/01/16  1:06 PM  Result Value Ref Range   WBC 16.6 (H) 3.6 - 11.0 K/uL   RBC 4.11 3.80 - 5.20 MIL/uL   Hemoglobin 12.8 12.0 - 16.0 g/dL   HCT 16.1 09.6 - 04.5 %   MCV 94.1 80.0 - 100.0 fL   MCH 31.2 26.0 - 34.0 pg   MCHC 33.2 32.0 - 36.0 g/dL   RDW 40.9 81.1 - 91.4 %   Platelets 213 150 - 440 K/uL   Neutrophils Relative % 90 %   Neutro Abs 14.9 (H) 1.4 - 6.5 K/uL   Lymphocytes Relative 5 %   Lymphs Abs 0.8 (L) 1.0 - 3.6 K/uL   Monocytes Relative 5 %    Monocytes Absolute 0.9 0.2 - 0.9 K/uL   Eosinophils Relative 0 %   Eosinophils Absolute 0.0 0 - 0.7 K/uL   Basophils Relative 0 %   Basophils Absolute 0.0 0 - 0.1 K/uL  Basic metabolic panel     Status: Abnormal   Collection Time: 08/01/16  1:06 PM  Result Value Ref Range   Sodium 138 135 - 145 mmol/L   Potassium 3.5 3.5 - 5.1 mmol/L   Chloride 101 101 - 111 mmol/L   CO2 27 22 - 32 mmol/L   Glucose, Bld 146 (H) 65 - 99 mg/dL   BUN 18 6 - 20 mg/dL   Creatinine, Ser 7.82 (H) 0.44 - 1.00 mg/dL   Calcium 9.6 8.9 - 95.6 mg/dL   GFR calc non Af Amer 46 (L) >  60 mL/min   GFR calc Af Amer 53 (L) >60 mL/min   Anion gap 10 5 - 15  Troponin I     Status: None   Collection Time: 08/01/16  1:06 PM  Result Value Ref Range   Troponin I <0.03 <0.03 ng/mL  ABO/Rh     Status: None   Collection Time: 08/01/16  2:01 PM  Result Value Ref Range   ABO/RH(D) O POS    ____________________________________________  EKG My review and personal interpretation at Time: 11:58   Indication: fall  Rate: 90  Rhythm: a fib Axis: normal Other: prolonged qt, no STEMI ____________________________________________  RADIOLOGY  I personally reviewed all radiographic images ordered to evaluate for the above acute complaints and reviewed radiology reports and findings.  These findings were personally discussed with the patient.  Please see medical record for radiology report.  ____________________________________________   PROCEDURES  Procedure(s) performed:  Procedures    Critical Care performed: yes CRITICAL CARE Performed by: Willy Eddy   Total critical care time: 45 minutes  Critical care time was exclusive of separately billable procedures and treating other patients.  Critical care was necessary to treat or prevent imminent or life-threatening deterioration.  Critical care was time spent personally by me on the following activities: development of treatment plan with patient and/or  surrogate as well as nursing, discussions with consultants, evaluation of patient's response to treatment, examination of patient, obtaining history from patient or surrogate, ordering and performing treatments and interventions, ordering and review of laboratory studies, ordering and review of radiographic studies, pulse oximetry and re-evaluation of patient's condition.  ____________________________________________   INITIAL IMPRESSION / ASSESSMENT AND PLAN / ED COURSE  Pertinent labs & imaging results that were available during my care of the patient were reviewed by me and considered in my medical decision making (see chart for details).  DDX: ich, fracture, contusion, electrolye abn  Marissa Colon is a 83 y.o. who presents to the ED with fall from standing and facial and left hand trauma as described above. CT imaging ordered to evaluate for acute intracranial abnormality shows evidence of scattered IPH and small right frontal subdural hematoma. Patient also with evidence of left thumb fracture and fracture of the right lateral orbital wall and floor. Extraocular motions are intact. No evidence of other associated traumatic injury. Patient is a DO NOT RESUSCITATE and family would not want pursued neurosurgical intervention. I spoke with neurosurgery on-call at this facility who agrees to evaluate patient. He has recommended platelets in an effort to try to avoid neurosurgical intervention.  Will give ddavp. States that it would be appropriate for admission here and have discussed this with the patient and family at bedside.  Have discussed with the patient and available family all diagnostics and treatments performed thus far and all questions were answered to the best of my ability. The patient demonstrates understanding and agreement with plan.       ____________________________________________   FINAL CLINICAL IMPRESSION(S) / ED DIAGNOSES  Final diagnoses:  Injury of head, initial  encounter  Subdural hematoma without coma, with loss of consciousness of 30 minutes or less, initial encounter (HCC)  Closed Bennett's fracture of left thumb, initial encounter      NEW MEDICATIONS STARTED DURING THIS VISIT:  New Prescriptions   No medications on file     Note:  This document was prepared using Dragon voice recognition software and may include unintentional dictation errors.    Willy Eddy, MD 08/01/16 [phone]

## 2016-08-02 ENCOUNTER — Inpatient Hospital Stay: Payer: Medicare Other

## 2016-08-02 DIAGNOSIS — I629 Nontraumatic intracranial hemorrhage, unspecified: Secondary | ICD-10-CM

## 2016-08-02 LAB — BASIC METABOLIC PANEL
Anion gap: 6 (ref 5–15)
BUN: 19 mg/dL (ref 6–20)
CHLORIDE: 106 mmol/L (ref 101–111)
CO2: 29 mmol/L (ref 22–32)
CREATININE: 0.82 mg/dL (ref 0.44–1.00)
Calcium: 9.4 mg/dL (ref 8.9–10.3)
GFR calc Af Amer: >60 mL/min (ref >60)
GFR calc non Af Amer: >60 mL/min (ref >60)
GLUCOSE: 119 mg/dL — AB (ref 65–99)
Potassium: 4 mmol/L (ref 3.5–5.1)
Sodium: 141 mmol/L (ref 135–145)

## 2016-08-02 LAB — CBC
HEMATOCRIT: 32.2 % — AB (ref 35.0–47.0)
HEMOGLOBIN: 10.7 g/dL — AB (ref 12.0–16.0)
MCH: 31.1 pg (ref 26.0–34.0)
MCHC: 33.2 g/dL (ref 32.0–36.0)
MCV: 93.7 fL (ref 80.0–100.0)
Platelets: 185 10*3/uL (ref 150–440)
RBC: 3.44 MIL/uL — ABNORMAL LOW (ref 3.80–5.20)
RDW: 14.2 % (ref 11.5–14.5)
WBC: 12.6 10*3/uL — ABNORMAL HIGH (ref 3.6–11.0)

## 2016-08-02 LAB — PREPARE PLATELET PHERESIS: Unit division: 0

## 2016-08-02 LAB — BPAM PLATELET PHERESIS
Blood Product Expiration Date: 201806282359
ISSUE DATE / TIME: 201806261626
Unit Type and Rh: 5100

## 2016-08-02 LAB — GLUCOSE, CAPILLARY: Glucose-Capillary: 124 mg/dL — ABNORMAL HIGH (ref 65–99)

## 2016-08-02 MED ORDER — ACETAMINOPHEN 500 MG PO TABS
500.0000 mg | ORAL_TABLET | Freq: Once | ORAL | Status: AC
  Administered 2016-08-02: 22:00:00 500 mg via ORAL
  Filled 2016-08-02: qty 1

## 2016-08-02 NOTE — Progress Notes (Signed)
[facility] - [place] at [facility]   PATIENT NAME: Marissa Colon    MR#:  [id number]  DATE OF BIRTH:  December 12, 1923  SUBJECTIVE:  CHIEF COMPLAINT:   Chief Complaint  Patient presents with  . Fall   Patient is 83 year old Caucasian female with a history significant for history of asthma, dementia, vitamin D deficiency, constipation, hypertension, gastro-esophageal reflux disease, who presents to the hospital with complaints of fall. She hit her head and lost consciousness. She had an episode of nausea. On arrival to emergency room, she was noted to have a subcutaneous hematoma periorbitally, right frontal scalp, right subdural hematoma, cortical contusion, or subarachnoid blood around the frontal lobes, blood in the both occipital horns of lateral ventricles. Patient was seen by neurosurgeon, no intervention was recommended, conservative therapy. Patient feels good today, denies any headache, discomfort, able to move upper, lower extremities, no weakness, no changes in vision. Review of Systems  Unable to perform ROS: Dementia    VITAL SIGNS: Blood pressure 124/62, pulse 76, temperature 99.5 F (37.5 C), temperature source Oral, resp. rate 17, height 5\' 2"  (1.575 m), weight 68.5 kg (151 lb), SpO2 95 %.  PHYSICAL EXAMINATION:   GENERAL:  83 y.o.-year-old patient lying in the bed with no acute distress. Right frontal, right neck area, right periorbital hematoma .  EYES: Pupils equal, round, reactive to light and accommodation. No scleral icterus. Extraocular muscles intact.  HEENT: Head atraumatic, normocephalic. Oropharynx and nasopharynx clear.  NECK:  Supple, no jugular venous distention. No thyroid enlargement, no tenderness.  LUNGS: Normal breath sounds bilaterally, no wheezing, rales,rhonchi or crepitation. No use of accessory muscles of respiration.  CARDIOVASCULAR: S1, S2 normal. No murmurs, rubs, or gallops.  ABDOMEN: Soft, nontender, nondistended.  Bowel sounds present. No organomegaly or mass.  EXTREMITIES: No pedal edema, cyanosis, or clubbing.  NEUROLOGIC: Cranial nerves II through XII are intact. Muscle strength 5/5 in all extremities. Sensation intact. Gait not checked.  PSYCHIATRIC: The patient is alert , able to answer some questions appropriately, able to follow some commands.  SKIN: No obvious rash, lesion, or ulcer.   ORDERS/RESULTS REVIEWED:   CBC  Recent Labs Lab 08/01/16 1306 08/02/16 0453  WBC 16.6* 12.6*  HGB 12.8 10.7*  HCT 38.7 32.2*  PLT 213 185  MCV 94.1 93.7  MCH 31.2 31.1  MCHC 33.2 33.2  RDW 14.1 14.2  LYMPHSABS 0.8*  --   MONOABS 0.9  --   EOSABS 0.0  --   BASOSABS 0.0  --    ------------------------------------------------------------------------------------------------------------------  Chemistries   Recent Labs Lab 08/01/16 1306 08/02/16 0453  NA 138 141  K 3.5 4.0  CL 101 106  CO2 27 29  GLUCOSE 146* 119*  BUN 18 19  CREATININE 1.04* 0.82  CALCIUM 9.6 9.4   ------------------------------------------------------------------------------------------------------------------ estimated creatinine clearance is 41.4 mL/min (by C-G formula based on SCr of 0.82 mg/dL). ------------------------------------------------------------------------------------------------------------------ No results for input(s): TSH, T4TOTAL, T3FREE, THYROIDAB in the last 72 hours.  Invalid input(s): FREET3  Cardiac Enzymes  Recent Labs Lab 08/01/16 1306  TROPONINI <0.03   ------------------------------------------------------------------------------------------------------------------ Invalid input(s): POCBNP ---------------------------------------------------------------------------------------------------------------  RADIOLOGY: Dg Chest 2 View  Result Date: 08/01/2016 CLINICAL DATA:  Fall. EXAM: CHEST  2 VIEW COMPARISON:  Chest x-ray 07/06/1999 report . FINDINGS: Prior CABG. Cardiomegaly with mild  bilateral interstitial prominence consistent with CHF. No pleural effusion or pneumothorax. Degenerative changes and osteopenia thoracic spine. Mild lower thoracic vertebral body compression fractures, age undetermined. IMPRESSION: Prior CABG. Cardiomegaly with diffuse mild  bilateral interstitial prominence consistent with mild CHF. Electronically Signed   By: Maisie Fus  Register   On: 08/01/2016 13:07   Ct Head Wo Contrast  Result Date: 08/02/2016 CLINICAL DATA:  Fall yesterday with head injury. Follow-up subdural hematoma. EXAM: CT HEAD WITHOUT CONTRAST TECHNIQUE: Contiguous axial images were obtained from the base of the skull through the vertex without intravenous contrast. COMPARISON:  CT head 08/01/2016. FINDINGS: Brain: Subdural hematoma adjacent to the right temporal lobe has mildly enlarged, measuring up to 10 mm in thickness on image number 9 (previously 7 mm). Cortical contusions or subarachnoid hemorrhage along both frontal lobes is not significantly changed. There is slightly greater layering blood within both occipital horns. Ventriculomegaly, periventricular low-density and other scattered chronic small vessel ischemic changes are stable. There is no midline shift or evidence of acute stroke. Vascular: Diffuse intracranial vascular calcifications. Skull: No evidence of calvarial fracture. There is right frontal scalp and right periorbital soft tissue swelling. Sinuses/Orbits: The visualized paranasal sinuses, mastoid air cells and middle ears are clear. No acute orbital findings. There is periorbital soft tissue swelling on the right. Postsurgical changes are present in both globes. Other: None. IMPRESSION: 1. Right temporal subdural hematoma has mildly enlarged from yesterday. 2. Mildly progressive intraventricular hemorrhage. Stable ventriculomegaly. 3. No midline shift or other significant changes. Electronically Signed   By: Carey Bullocks M.D.   On: 08/02/2016 11:23   Ct Head Wo  Contrast  Result Date: 08/01/2016 CLINICAL DATA:  Pt to ED via EMS from [facility] for a fall this morning, EMS states per facility it was a mechanical witnessed fall and then had a vagal episode on the toilet. EXAM: CT HEAD WITHOUT CONTRAST CT MAXILLOFACIAL WITHOUT CONTRAST TECHNIQUE: Multidetector CT imaging of the head and maxillofacial structures were performed using the standard protocol without intravenous contrast. Multiplanar CT image reconstructions of the maxillofacial structures were also generated. COMPARISON:  None FINDINGS: CT HEAD FINDINGS Brain: There is intracranial hemorrhage. There is a small right temporal subdural hematoma measuring 6.5 mm in thickness. Small foci of hemorrhage are seen along the cortical margins of the frontal lobes anteriorly, consistent with parenchymal contusions. There is intraventricular hemorrhage. Hemorrhage is seen layering in the dependent portions of the lateral ventricle occipital horns and right temporal horn. There is hemorrhage within the right lateral ventricle anterior horn hand adjacent to the right septum pellucidum. A tiny focus of hemorrhage is seen along the anterior interhemispheric fissure. The ventricles are enlarged along the sulci reflecting moderate generalized atrophy. No convincing hydrocephalus. There is no old lacune infarct in the left basal ganglia. Patchy white matter hypoattenuation is noted bilaterally consistent with moderate chronic microvascular ischemic change. There is no evidence of an ischemic infarct. Vascular: No hyperdense vessel or unexpected calcification. Skull: No skull fracture.  No skull lesion. Other: Right periorbital soft tissue hemorrhage described below. CT MAXILLOFACIAL FINDINGS Osseous: Subtle nondisplaced fracture of the lateral right orbital wall, which could be acute or old. Mild depression of the floor of the right orbit which may reflect another acute fracture but could be chronic. No other evidence of a  fracture. No bone lesions. Orbits: There is significant right preseptal periorbital hemorrhage extending to the right cheek and right lower forehead and temple region. No postseptal orbital hemorrhage. Globe is intact. Left globe and orbit are unremarkable. Sinuses: Clear sinuses, mastoid air cells and middle ear cavities. Soft tissues: No soft tissue masses or adenopathy. IMPRESSION: 1. Acute intracranial hemorrhage. There are small foci of cortical hemorrhage  reflecting contusions along the anterior frontal lobes. There is a small right temporal subdural hematoma and there is a small amount of intraventricular hemorrhage. No mass effect or midline shift. 2. Nondisplaced fracture of the right lateral orbital wall and mildly depressed fracture of right orbital floor, both of which could be acute or chronic. A chronic etiology is supported given the lack of adjacent edema and lack of sinus fluid. No other evidence of a fracture. 3. Significant right preseptal periorbital hemorrhage extending to the right inferior forehead, right temple region right cheek. No evidence of injury to the right globe or postseptal orbit. Critical Value/emergent results were called by telephone at the time of interpretation on 08/01/2016 at 12:56 pm to Dr. Ernestina Columbia, who verbally acknowledged these results. Electronically Signed   By: Amie Portland M.D.   On: 08/01/2016 12:53   Ct Cervical Spine Wo Contrast  Result Date: 08/01/2016 CLINICAL DATA:  Fall EXAM: CT CERVICAL SPINE WITHOUT CONTRAST TECHNIQUE: Multidetector CT imaging of the cervical spine was performed without intravenous contrast. Multiplanar CT image reconstructions were also generated. COMPARISON:  None. FINDINGS: Alignment: No static subluxation. Facets are aligned. Occipital condyles and the lateral masses of C1 and C2 are normally approximated. Skull base and vertebrae: No acute fracture. Soft tissues and spinal canal: No prevertebral fluid or swelling. No visible canal  hematoma. Disc levels: There is multilevel severe facet hypertrophy and disc degeneration with uncovertebral hypertrophy. The left C3-C4 facets are fused. There is severe stenosis of the right C6-7 neural foramen. No bony spinal canal stenosis. Upper chest: No pneumothorax, pulmonary nodule or pleural effusion. There is aortic atherosclerosis. Other: Normal visualized paraspinal cervical soft tissues. IMPRESSION: 1. No acute fracture or static subluxation of the cervical spine. 2. 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 3.  Aortic Atherosclerosis (ICD10-I70.0). Electronically Signed   By: Deatra Robinson M.D.   On: 08/01/2016 13:46   Dg Hand Complete Left  Result Date: 08/01/2016 CLINICAL DATA:  Larey Seat and injured left hand. EXAM: LEFT HAND - COMPLETE 3+ VIEW COMPARISON:  None. FINDINGS: Moderate to advanced degenerative changes involving the DIP and PIP joints of the fingers. The MCP joints are maintained. The radiocarpal and intercarpal joint spaces are maintained. No wrist fracture is identified. There is a fracture involving the base of the thumb with probable intra-articular component at the [facility] joint. No associated dislocation. IMPRESSION: Comminuted intra-articular fracture involving the base of the thumb Willeen Cass fracture). No associated dislocation. Degenerative changes but no other fractures. Electronically Signed   By: Rudie Meyer M.D.   On: 08/01/2016 13:10   Ct Maxillofacial Wo Contrast  Result Date: 08/01/2016 CLINICAL DATA:  Pt to ED via EMS from [facility] for a fall this morning, EMS states per facility it was a mechanical witnessed fall and then had a vagal episode on the toilet. EXAM: CT HEAD WITHOUT CONTRAST CT MAXILLOFACIAL WITHOUT CONTRAST TECHNIQUE: Multidetector CT imaging of the head and maxillofacial structures were performed using the standard protocol without intravenous contrast. Multiplanar CT image reconstructions of the maxillofacial  structures were also generated. COMPARISON:  None FINDINGS: CT HEAD FINDINGS Brain: There is intracranial hemorrhage. There is a small right temporal subdural hematoma measuring 6.5 mm in thickness. Small foci of hemorrhage are seen along the cortical margins of the frontal lobes anteriorly, consistent with parenchymal contusions. There is intraventricular hemorrhage. Hemorrhage is seen layering in the dependent portions of the lateral ventricle occipital horns and right temporal horn. There is hemorrhage  within the right lateral ventricle anterior horn hand adjacent to the right septum pellucidum. A tiny focus of hemorrhage is seen along the anterior interhemispheric fissure. The ventricles are enlarged along the sulci reflecting moderate generalized atrophy. No convincing hydrocephalus. There is no old lacune infarct in the left basal ganglia. Patchy white matter hypoattenuation is noted bilaterally consistent with moderate chronic microvascular ischemic change. There is no evidence of an ischemic infarct. Vascular: No hyperdense vessel or unexpected calcification. Skull: No skull fracture.  No skull lesion. Other: Right periorbital soft tissue hemorrhage described below. CT MAXILLOFACIAL FINDINGS Osseous: Subtle nondisplaced fracture of the lateral right orbital wall, which could be acute or old. Mild depression of the floor of the right orbit which may reflect another acute fracture but could be chronic. No other evidence of a fracture. No bone lesions. Orbits: There is significant right preseptal periorbital hemorrhage extending to the right cheek and right lower forehead and temple region. No postseptal orbital hemorrhage. Globe is intact. Left globe and orbit are unremarkable. Sinuses: Clear sinuses, mastoid air cells and middle ear cavities. Soft tissues: No soft tissue masses or adenopathy. IMPRESSION: 1. Acute intracranial hemorrhage. There are small foci of cortical hemorrhage reflecting contusions along  the anterior frontal lobes. There is a small right temporal subdural hematoma and there is a small amount of intraventricular hemorrhage. No mass effect or midline shift. 2. Nondisplaced fracture of the right lateral orbital wall and mildly depressed fracture of right orbital floor, both of which could be acute or chronic. A chronic etiology is supported given the lack of adjacent edema and lack of sinus fluid. No other evidence of a fracture. 3. Significant right preseptal periorbital hemorrhage extending to the right inferior forehead, right temple region right cheek. No evidence of injury to the right globe or postseptal orbit. Critical Value/emergent results were called by telephone at the time of interpretation on 08/01/2016 at 12:56 pm to Dr. Ernestina Columbia, who verbally acknowledged these results. Electronically Signed   By: Amie Portland M.D.   On: 08/01/2016 12:53    EKG:  Orders placed or performed during the hospital encounter of 08/01/16  . EKG 12-Lead  . EKG 12-Lead    ASSESSMENT AND PLAN:  Active Problems:   Intracranial hemorrhage (HCC)  #1. Intracranial hemorrhage, status post desmopressin intravenous administration and platelet transfusion, repeat the CT scan of the head revealed minimal changes, patient was seen by neurologist, who recommended to continue the same conservative therapy. Patient was seen by neurosurgeon, no intervention was recommended #2. Fever, likely central, continue Tylenol #3. Hyperglycemia, get Hgb a1c #4 acute renal insufficiency, resolved #5. Leukocytosis, some improved #6. Anemia, follow closely, transfuse as needed Management plans discussed with the patient, family and they are in agreement.   DRUG ALLERGIES: No Known Allergies  CODE STATUS:     Code Status Orders        Start     Ordered   08/01/16 1753  Do not attempt resuscitation (DNR)  Continuous    Question Answer Comment  In the event of cardiac or respiratory ARREST Do not call a "code  blue"   In the event of cardiac or respiratory ARREST Do not perform Intubation, CPR, defibrillation or ACLS   In the event of cardiac or respiratory ARREST Use medication by any route, position, wound care, and other measures to relive pain and suffering. May use oxygen, suction and manual treatment of airway obstruction as needed for comfort.   Comments rn may pronounce  08/01/16 1752    Code Status History    Date Active Date Inactive Code Status Order ID Comments User Context   08/01/2016  1:46 PM 08/01/2016  5:52 PM DNR [id number]  Willy Eddyobinson, Patrick, MD ED    Advance Directive Documentation     Most Recent Value  Type of Advance Directive  Healthcare Power of Attorney, Out of facility DNR (pink MOST or yellow form)  Pre-existing out of facility DNR order (yellow form or pink MOST form)  Yellow form placed in chart (order not valid for inpatient use)  "MOST" Form in Place?  -      TOTAL TIME TAKING CARE OF THIS PATIENT: 40 minutes.  Discussed with patient's son  Katharina CaperVAICKUTE,Phyliss Hulick M.D on 08/02/2016 at 3:40 PM  Between 7am to 6pm - Pager - [phone]  After 6pm go to [email or web address] - password [email or web address]  Fabio Neighborsagle [place] [facility]  Office  [phone]  CC: Primary care physician; Patient, No Pcp Per

## 2016-08-02 NOTE — Consult Note (Signed)
Reason for Consult:ICH Referring Physician: Winona Legato  CC: Fall  HPI: Marissa Colon is an 83 y.o. female who is unable to provide history due to her dementia.  All history obtained from the son.  Patient was brought into the is brought into the ED after she sustained a fall. Patient had one episode of nausea. Patient hit her head and subsequently she lost consciousness. In the ED she had a right-sided black eye . CT head has revealed intracranial bleed with no midline shift. Patient's son requested to admit the patient here as she is DO NOT RESUSCITATE and not considering any surgery. Patient fell in May as well.  Had a mental status changed after that fall but currently is at the baseline since that fall.      Past Medical History:  Diagnosis Date  . Alzheimer's disease   . Alzheimer's disease   . Avitaminosis D   . CN (constipation)   . Esophageal reflux   . Essential hypertension, malignant   . Heart failure, unspecified (HCC)   . Senile arthritis   . 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 ABDOMINAL HYSTERECTOMY    . BYPASS GRAFT      Family history: Son alive and well  Social History:  reports that she has never smoked. She has never used smokeless tobacco. She reports that she does not drink alcohol or use drugs.  No Known Allergies  Medications:  I have reviewed the patient's current medications. Prior to Admission:  Prescriptions Prior to Admission  Medication Sig Dispense Refill Last Dose  . acetaminophen (TYLENOL) 500 MG tablet Take 500 mg by mouth every 6 (six) hours as needed.   prn at prn  . amLODipine (NORVASC) 5 MG tablet Take 5 mg by mouth daily.   08/01/2016 at 0800  . aspirin 81 MG tablet Take 81 mg by mouth daily.   08/01/2016 at 0800  . bismuth subsalicylate (KAOPECTATE) 262 MG/15ML suspension Take 30 mLs by mouth every 4 (four) hours as needed.   prn at prn  . citalopram (CELEXA) 40 MG tablet Take 40  mg by mouth daily.   07/31/2016 at 2000  . clopidogrel (PLAVIX) 75 MG tablet Take 75 mg by mouth daily with breakfast.   08/01/2016 at 0800  . CRANBERRY PO Take 1 tablet by mouth 2 (two) times daily.   08/01/2016 at 0800  . donepezil (ARICEPT) 10 MG tablet Take 10 mg by mouth daily.    08/01/2016 at 0800  . ergocalciferol (VITAMIN D2) 50000 units capsule Take 50,000 Units by mouth once a week.   07/28/2016  . lactase (LACTAID) 3000 units tablet Take 6,000 Units by mouth 3 (three) times daily with meals.   08/01/2016 at 0800  . lisinopril (PRINIVIL,ZESTRIL) 10 MG tablet Take 10 mg by mouth at bedtime.   07/31/2016 at qhs  . memantine (NAMENDA XR) 14 MG CP24 24 hr capsule Take 14 mg by mouth daily.   08/01/2016 at 0800  . metoprolol succinate (TOPROL-XL) 50 MG 24 hr tablet Take 50 mg by mouth daily. Take with or immediately following a meal.    08/01/2016 at 0800  . Multiple Vitamin (MULTIVITAMIN WITH MINERALS) TABS tablet Take 1 tablet by mouth daily.   08/01/2016 at 0800  . naproxen (NAPROSYN) 500 MG tablet Take 500 mg by mouth 2 (two) times daily as needed.   prn at prn  . nitroGLYCERIN (NITROSTAT) 0.4 MG SL tablet Place 0.4  mg under the tongue every 5 (five) minutes as needed for chest pain.   prn at prn  . polyethylene glycol (MIRALAX / GLYCOLAX) packet Take 17 g by mouth daily as needed.    prn at prn  . ranitidine (ZANTAC) 150 MG tablet Take 150 mg by mouth daily.   08/01/2016 at 0600  . vitamin C (ASCORBIC ACID) 500 MG tablet Take 500 mg by mouth daily.   08/01/2016 at 0800   Scheduled: . citalopram  40 mg Oral Daily  . donepezil  10 mg Oral Daily  . famotidine  20 mg Oral Daily  . lactase  6,000 Units Oral TID WC  . lisinopril  10 mg Oral QHS  . memantine  14 mg Oral Daily  . metoprolol succinate  50 mg Oral Daily  . multivitamin with minerals  1 tablet Oral Daily  . [START ON 08/04/2016] Vitamin D (Ergocalciferol)  50,000 Units Oral Weekly    ROS: History obtained from son  General ROS:  negative for - chills, fatigue, fever, night sweats, weight gain or weight loss Psychological ROS: negative for - behavioral disorder, hallucinations, memory difficulties, mood swings or suicidal ideation Ophthalmic ROS: negative for - blurry vision, double vision, eye pain or loss of vision ENT ROS: negative for - epistaxis, nasal discharge, oral lesions, sore throat, tinnitus or vertigo Allergy and Immunology ROS: negative for - hives or itchy/watery eyes Hematological and Lymphatic ROS: negative for - bleeding problems, bruising or swollen lymph nodes Endocrine ROS: negative for - galactorrhea, hair pattern changes, polydipsia/polyuria or temperature intolerance Respiratory ROS: negative for - cough, hemoptysis, shortness of breath or wheezing Cardiovascular ROS: negative for - chest pain, dyspnea on exertion, edema or irregular heartbeat Gastrointestinal ROS: as noted in HPI Genito-Urinary ROS: negative for - dysuria, hematuria, incontinence or urinary frequency/urgency Musculoskeletal ROS: negative for - joint swelling or muscular weakness Neurological ROS: as noted in HPI Dermatological ROS: negative for rash and skin lesion changes  Physical Examination: Blood pressure 124/62, pulse 76, temperature 99.5 F (37.5 C), temperature source Oral, resp. rate 17, height 5\' 2"  (1.575 m), weight 68.5 kg (151 lb), SpO2 95 %.  HEENT-  Normocephalic.  Right raccoon eye.  Normal TM's bilaterally.  Normal auditory canals and external ears. Normal external nose, mucus membranes and septum.  Normal pharynx. Cardiovascular- S1, S2 normal, pulses palpable throughout   Lungs- chest clear, no wheezing, rales, normal symmetric air entry Abdomen- soft, non-tender; bowel sounds normal; no masses,  no organomegaly Extremities- no edema Lymph-no adenopathy palpable Musculoskeletal-no joint tenderness, deformity or swelling Skin-bruising  Neurological Examination   Mental Status: Alert.  Pleasant.  Not  oriented.  Follows simple commands.  Speech dysarthric but fluent. Cranial Nerves: II: Discs flat bilaterally; Visual Duggin grossly normal, pupils equal, round, reactive to light and accommodation III,IV, VI: right ptosis, extra-ocular motions intact bilaterally V,VII: smile symmetric, facial light touch sensation normal bilaterally VIII: hearing normal bilaterally IX,X: gag reflex present XI: bilateral shoulder shrug XII: midline tongue extension Motor: Lifts all extremities strongly against gravity with mild decrease in strength in the LLE.   Sensory: Pinprick and light touch intact throughout, bilaterally Deep Tendon Reflexes: 2+ in the upper extremities and absent in the lower extremities Plantars: Right: mute   Left: mute Cerebellar: Normal finger-to-nose and normal heel-to-shin testing Gait: not tested due to safety concerns    Laboratory Studies:   Basic Metabolic Panel:  Recent Labs Lab 08/01/16 1306 08/02/16 0453  NA 138 141  K 3.5  4.0  CL 101 106  CO2 27 29  GLUCOSE 146* 119*  BUN 18 19  CREATININE 1.04* 0.82  CALCIUM 9.6 9.4    Liver Function Tests: No results for input(s): AST, ALT, ALKPHOS, BILITOT, PROT, ALBUMIN in the last 168 hours. No results for input(s): LIPASE, AMYLASE in the last 168 hours. No results for input(s): AMMONIA in the last 168 hours.  CBC:  Recent Labs Lab 08/01/16 1306 08/02/16 0453  WBC 16.6* 12.6*  NEUTROABS 14.9*  --   HGB 12.8 10.7*  HCT 38.7 32.2*  MCV 94.1 93.7  PLT 213 185    Cardiac Enzymes:  Recent Labs Lab 08/01/16 1306  TROPONINI <0.03    BNP: Invalid input(s): POCBNP  CBG: No results for input(s): GLUCAP in the last 168 hours.  Microbiology: Results for orders placed or performed during the hospital encounter of 08/01/16  MRSA PCR Screening     Status: None   Collection Time: 08/01/16  5:58 PM  Result Value Ref Range Status   MRSA by PCR NEGATIVE NEGATIVE Final    Comment:        The GeneXpert  MRSA Assay (FDA approved for NASAL specimens only), is one component of a comprehensive MRSA colonization surveillance program. It is not intended to diagnose MRSA infection nor to guide or monitor treatment for MRSA infections.     Coagulation Studies: No results for input(s): LABPROT, INR in the last 72 hours.  Urinalysis: No results for input(s): COLORURINE, LABSPEC, PHURINE, GLUCOSEU, HGBUR, BILIRUBINUR, KETONESUR, PROTEINUR, UROBILINOGEN, NITRITE, LEUKOCYTESUR in the last 168 hours.  Invalid input(s): APPERANCEUR  Lipid Panel:  No results found for: CHOL, TRIG, HDL, CHOLHDL, VLDL, LDLCALC  HgbA1C: No results found for: HGBA1C  Urine Drug Screen:  No results found for: LABOPIA, COCAINSCRNUR, LABBENZ, AMPHETMU, THCU, LABBARB  Alcohol Level: No results for input(s): ETH in the last 168 hours.  Other results: EKG: atrial fibrillation, rate 89 bpm.  Imaging: Dg Chest 2 View  Result Date: 08/01/2016 CLINICAL DATA:  Fall. EXAM: CHEST  2 VIEW COMPARISON:  Chest x-ray 07/06/1999 report . FINDINGS: Prior CABG. Cardiomegaly with mild bilateral interstitial prominence consistent with CHF. No pleural effusion or pneumothorax. Degenerative changes and osteopenia thoracic spine. Mild lower thoracic vertebral body compression fractures, age undetermined. IMPRESSION: Prior CABG. Cardiomegaly with diffuse mild bilateral interstitial prominence consistent with mild CHF. Electronically Signed   By: Maisie Fushomas  Register   On: 08/01/2016 13:07   Ct Head Wo Contrast  Result Date: 08/02/2016 CLINICAL DATA:  Fall yesterday with head injury. Follow-up subdural hematoma. EXAM: CT HEAD WITHOUT CONTRAST TECHNIQUE: Contiguous axial images were obtained from the base of the skull through the vertex without intravenous contrast. COMPARISON:  CT head 08/01/2016. FINDINGS: Brain: Subdural hematoma adjacent to the right temporal lobe has mildly enlarged, measuring up to 10 mm in thickness on image number 9  (previously 7 mm). Cortical contusions or subarachnoid hemorrhage along both frontal lobes is not significantly changed. There is slightly greater layering blood within both occipital horns. Ventriculomegaly, periventricular low-density and other scattered chronic small vessel ischemic changes are stable. There is no midline shift or evidence of acute stroke. Vascular: Diffuse intracranial vascular calcifications. Skull: No evidence of calvarial fracture. There is right frontal scalp and right periorbital soft tissue swelling. Sinuses/Orbits: The visualized paranasal sinuses, mastoid air cells and middle ears are clear. No acute orbital findings. There is periorbital soft tissue swelling on the right. Postsurgical changes are present in both globes. Other: None. IMPRESSION: 1. Right  temporal subdural hematoma has mildly enlarged from yesterday. 2. Mildly progressive intraventricular hemorrhage. Stable ventriculomegaly. 3. No midline shift or other significant changes. Electronically Signed   By: Carey Bullocks M.D.   On: 08/02/2016 11:23   Ct Head Wo Contrast  Result Date: 08/01/2016 CLINICAL DATA:  Pt to ED via EMS from [facility] for a fall this morning, EMS states per facility it was a mechanical witnessed fall and then had a vagal episode on the toilet. EXAM: CT HEAD WITHOUT CONTRAST CT MAXILLOFACIAL WITHOUT CONTRAST TECHNIQUE: Multidetector CT imaging of the head and maxillofacial structures were performed using the standard protocol without intravenous contrast. Multiplanar CT image reconstructions of the maxillofacial structures were also generated. COMPARISON:  None FINDINGS: CT HEAD FINDINGS Brain: There is intracranial hemorrhage. There is a small right temporal subdural hematoma measuring 6.5 mm in thickness. Small foci of hemorrhage are seen along the cortical margins of the frontal lobes anteriorly, consistent with parenchymal contusions. There is intraventricular hemorrhage. Hemorrhage is seen  layering in the dependent portions of the lateral ventricle occipital horns and right temporal horn. There is hemorrhage within the right lateral ventricle anterior horn hand adjacent to the right septum pellucidum. A tiny focus of hemorrhage is seen along the anterior interhemispheric fissure. The ventricles are enlarged along the sulci reflecting moderate generalized atrophy. No convincing hydrocephalus. There is no old lacune infarct in the left basal ganglia. Patchy white matter hypoattenuation is noted bilaterally consistent with moderate chronic microvascular ischemic change. There is no evidence of an ischemic infarct. Vascular: No hyperdense vessel or unexpected calcification. Skull: No skull fracture.  No skull lesion. Other: Right periorbital soft tissue hemorrhage described below. CT MAXILLOFACIAL FINDINGS Osseous: Subtle nondisplaced fracture of the lateral right orbital wall, which could be acute or old. Mild depression of the floor of the right orbit which may reflect another acute fracture but could be chronic. No other evidence of a fracture. No bone lesions. Orbits: There is significant right preseptal periorbital hemorrhage extending to the right cheek and right lower forehead and temple region. No postseptal orbital hemorrhage. Globe is intact. Left globe and orbit are unremarkable. Sinuses: Clear sinuses, mastoid air cells and middle ear cavities. Soft tissues: No soft tissue masses or adenopathy. IMPRESSION: 1. Acute intracranial hemorrhage. There are small foci of cortical hemorrhage reflecting contusions along the anterior frontal lobes. There is a small right temporal subdural hematoma and there is a small amount of intraventricular hemorrhage. No mass effect or midline shift. 2. Nondisplaced fracture of the right lateral orbital wall and mildly depressed fracture of right orbital floor, both of which could be acute or chronic. A chronic etiology is supported given the lack of adjacent edema  and lack of sinus fluid. No other evidence of a fracture. 3. Significant right preseptal periorbital hemorrhage extending to the right inferior forehead, right temple region right cheek. No evidence of injury to the right globe or postseptal orbit. Critical Value/emergent results were called by telephone at the time of interpretation on 08/01/2016 at 12:56 pm to Dr. Ernestina Columbia, who verbally acknowledged these results. Electronically Signed   By: Amie Portland M.D.   On: 08/01/2016 12:53   Ct Cervical Spine Wo Contrast  Result Date: 08/01/2016 CLINICAL DATA:  Fall EXAM: CT CERVICAL SPINE WITHOUT CONTRAST TECHNIQUE: Multidetector CT imaging of the cervical spine was performed without intravenous contrast. Multiplanar CT image reconstructions were also generated. COMPARISON:  None. FINDINGS: Alignment: No static subluxation. Facets are aligned. Occipital condyles and the lateral masses  of C1 and C2 are normally approximated. Skull base and vertebrae: No acute fracture. Soft tissues and spinal canal: No prevertebral fluid or swelling. No visible canal hematoma. Disc levels: There is multilevel severe facet hypertrophy and disc degeneration with uncovertebral hypertrophy. The left C3-C4 facets are fused. There is severe stenosis of the right C6-7 neural foramen. No bony spinal canal stenosis. Upper chest: No pneumothorax, pulmonary nodule or pleural effusion. There is aortic atherosclerosis. Other: Normal visualized paraspinal cervical soft tissues. IMPRESSION: 1. No acute fracture or static subluxation of the cervical spine. 2. 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 3.  Aortic Atherosclerosis (ICD10-I70.0). Electronically Signed   By: Deatra Robinson M.D.   On: 08/01/2016 13:46   Dg Hand Complete Left  Result Date: 08/01/2016 CLINICAL DATA:  Larey Seat and injured left hand. EXAM: LEFT HAND - COMPLETE 3+ VIEW COMPARISON:  None. FINDINGS: Moderate to advanced degenerative changes  involving the DIP and PIP joints of the fingers. The MCP joints are maintained. The radiocarpal and intercarpal joint spaces are maintained. No wrist fracture is identified. There is a fracture involving the base of the thumb with probable intra-articular component at the [facility] joint. No associated dislocation. IMPRESSION: Comminuted intra-articular fracture involving the base of the thumb Willeen Cass fracture). No associated dislocation. Degenerative changes but no other fractures. Electronically Signed   By: Rudie Meyer M.D.   On: 08/01/2016 13:10   Ct Maxillofacial Wo Contrast  Result Date: 08/01/2016 CLINICAL DATA:  Pt to ED via EMS from [facility] for a fall this morning, EMS states per facility it was a mechanical witnessed fall and then had a vagal episode on the toilet. EXAM: CT HEAD WITHOUT CONTRAST CT MAXILLOFACIAL WITHOUT CONTRAST TECHNIQUE: Multidetector CT imaging of the head and maxillofacial structures were performed using the standard protocol without intravenous contrast. Multiplanar CT image reconstructions of the maxillofacial structures were also generated. COMPARISON:  None FINDINGS: CT HEAD FINDINGS Brain: There is intracranial hemorrhage. There is a small right temporal subdural hematoma measuring 6.5 mm in thickness. Small foci of hemorrhage are seen along the cortical margins of the frontal lobes anteriorly, consistent with parenchymal contusions. There is intraventricular hemorrhage. Hemorrhage is seen layering in the dependent portions of the lateral ventricle occipital horns and right temporal horn. There is hemorrhage within the right lateral ventricle anterior horn hand adjacent to the right septum pellucidum. A tiny focus of hemorrhage is seen along the anterior interhemispheric fissure. The ventricles are enlarged along the sulci reflecting moderate generalized atrophy. No convincing hydrocephalus. There is no old lacune infarct in the left basal ganglia. Patchy white matter  hypoattenuation is noted bilaterally consistent with moderate chronic microvascular ischemic change. There is no evidence of an ischemic infarct. Vascular: No hyperdense vessel or unexpected calcification. Skull: No skull fracture.  No skull lesion. Other: Right periorbital soft tissue hemorrhage described below. CT MAXILLOFACIAL FINDINGS Osseous: Subtle nondisplaced fracture of the lateral right orbital wall, which could be acute or old. Mild depression of the floor of the right orbit which may reflect another acute fracture but could be chronic. No other evidence of a fracture. No bone lesions. Orbits: There is significant right preseptal periorbital hemorrhage extending to the right cheek and right lower forehead and temple region. No postseptal orbital hemorrhage. Globe is intact. Left globe and orbit are unremarkable. Sinuses: Clear sinuses, mastoid air cells and middle ear cavities. Soft tissues: No soft tissue masses or adenopathy. IMPRESSION: 1. Acute intracranial hemorrhage. There are small  foci of cortical hemorrhage reflecting contusions along the anterior frontal lobes. There is a small right temporal subdural hematoma and there is a small amount of intraventricular hemorrhage. No mass effect or midline shift. 2. Nondisplaced fracture of the right lateral orbital wall and mildly depressed fracture of right orbital floor, both of which could be acute or chronic. A chronic etiology is supported given the lack of adjacent edema and lack of sinus fluid. No other evidence of a fracture. 3. Significant right preseptal periorbital hemorrhage extending to the right inferior forehead, right temple region right cheek. No evidence of injury to the right globe or postseptal orbit. Critical Value/emergent results were called by telephone at the time of interpretation on 08/01/2016 at 12:56 pm to Dr. Ernestina Columbia, who verbally acknowledged these results. Electronically Signed   By: Amie Portland M.D.   On: 08/01/2016  12:53     Assessment/Plan: 83 year old female with history of dementia presenting after a fall.  Initial head CT reviewed and shows bilateral frontal hemorrhages secondary to contusion as well as a right temporal SDH and intraventricular hemorrhage. Patient was given platelets and DDAVP.  Head CT repeated again today which shows some progression of the intraventricular and subdural hemorrhages, although mild,  No evidence of hydrocephalus or midline shift.  Patient awake and alert.  Patient DNR and sone does not wish for surgical intervention.     Recommendations: 1.  Agree with holding all antiplatelet and anticoagulation 2.  Neuro checks.  Would not repeat CT unless patient deteriorates clinically.   Thana Farr, MD Neurology [phone] 08/02/2016, 3:55 PM

## 2016-08-02 NOTE — Clinical Social Work Note (Signed)
CSW received consult that patient is from [facility].  CSW to complete assessment at a later time.  Marissa KnackEric R. Hassan Rowannterhaus, MSW, Theresia MajorsLCSWA [phone]  08/02/2016 5:48 PM

## 2016-08-03 ENCOUNTER — Inpatient Hospital Stay: Payer: Medicare Other

## 2016-08-03 DIAGNOSIS — I6200 Nontraumatic subdural hemorrhage, unspecified: Secondary | ICD-10-CM

## 2016-08-03 LAB — HEMOGLOBIN A1C
HEMOGLOBIN A1C: 5.3 % (ref 4.8–5.6)
Mean Plasma Glucose: 105 mg/dL

## 2016-08-03 LAB — CBC
HCT: 28.8 % — ABNORMAL LOW (ref 35.0–47.0)
Hemoglobin: 9.7 g/dL — ABNORMAL LOW (ref 12.0–16.0)
MCH: 31.9 pg (ref 26.0–34.0)
MCHC: 33.7 g/dL (ref 32.0–36.0)
MCV: 94.8 fL (ref 80.0–100.0)
PLATELETS: 147 10*3/uL — AB (ref 150–440)
RBC: 3.04 MIL/uL — AB (ref 3.80–5.20)
RDW: 14.1 % (ref 11.5–14.5)
WBC: 10.1 10*3/uL (ref 3.6–11.0)

## 2016-08-03 LAB — URINALYSIS, COMPLETE (UACMP) WITH MICROSCOPIC
Bilirubin Urine: NEGATIVE
Glucose, UA: NEGATIVE mg/dL
Hgb urine dipstick: NEGATIVE
Ketones, ur: NEGATIVE mg/dL
Nitrite: NEGATIVE
PROTEIN: NEGATIVE mg/dL
SPECIFIC GRAVITY, URINE: 1.02 (ref 1.005–1.030)
pH: 5 (ref 5.0–8.0)

## 2016-08-03 NOTE — Consult Note (Signed)
83 year old is seen at her bedside. She is a poor historian and does not give any history. As consulted for evaluation of left hand fracture. She has extensive ecchymosis around her face and her left hand on the thumb and thenar area. She is tender to palpation at the base of the first metacarpal. X-rays show a basilar first metacarpal fracture that is minimally displaced. This is something that is often require surgery but with her age and lack of displacement I suspect she will do fine with nonoperative treatment. A thumb spica splint was applied and I will want to see her back in another 2 weeks to either go with a cast or removable brace after x-ray in the office

## 2016-08-03 NOTE — Progress Notes (Signed)
Subjective: Patient awake and alert.  Unchanged from yesterday.    Objective: Current vital signs: BP (!) 129/47 (BP Location: Right Arm)   Pulse 61   Temp 98.2 F (36.8 C) (Oral)   Resp 19   Ht 5\' 2"  (1.575 m)   Wt 68.5 kg (151 lb)   LMP  (LMP Unknown)   SpO2 97%   BMI 27.62 kg/m  Vital signs in last 24 hours: Temp:  [98.2 F (36.8 C)-100.7 F (38.2 C)] 98.2 F (36.8 C) (06/28 0849) Pulse Rate:  [61-78] 61 (06/28 0849) Resp:  [18-20] 19 (06/28 0849) BP: (111-144)/(47-65) 129/47 (06/28 0849) SpO2:  [95 %-97 %] 97 % (06/28 0849)  Intake/Output from previous day: 06/27 0701 - 06/28 0700 In: 720 [P.O.:720] Out: -  Intake/Output this shift: No intake/output data recorded. Nutritional status: Diet regular Room service appropriate? Yes; Fluid consistency: Thin  Neurologic Exam: Mental Status: Alert.  Pleasant.  Not oriented.  Follows simple commands.  Speech dysarthric but fluent. Cranial Nerves: II: Discs flat bilaterally; Visual [place] grossly normal, pupils equal, round, reactive to light and accommodation III,IV, VI: right ptosis, extra-ocular motions intact bilaterally V,VII: smile symmetric, facial light touch sensation normal bilaterally VIII: hearing normal bilaterally IX,X: gag reflex present XI: bilateral shoulder shrug XII: midline tongue extension Motor: Lifts all extremities strongly against gravity with mild decrease in strength in the LLE.     Lab Results: Basic Metabolic Panel:  Recent Labs Lab 08/01/16 1306 08/02/16 0453  NA 138 141  K 3.5 4.0  CL 101 106  CO2 27 29  GLUCOSE 146* 119*  BUN 18 19  CREATININE 1.04* 0.82  CALCIUM 9.6 9.4    Liver Function Tests: No results for input(s): AST, ALT, ALKPHOS, BILITOT, PROT, ALBUMIN in the last 168 hours. No results for input(s): LIPASE, AMYLASE in the last 168 hours. No results for input(s): AMMONIA in the last 168 hours.  CBC:  Recent Labs Lab 08/01/16 1306 08/02/16 0453 08/03/16 0523   WBC 16.6* 12.6* 10.1  NEUTROABS 14.9*  --   --   HGB 12.8 10.7* 9.7*  HCT 38.7 32.2* 28.8*  MCV 94.1 93.7 94.8  PLT 213 185 147*    Cardiac Enzymes:  Recent Labs Lab 08/01/16 1306  TROPONINI <0.03    Lipid Panel: No results for input(s): CHOL, TRIG, HDL, CHOLHDL, VLDL, LDLCALC in the last 168 hours.  CBG:  Recent Labs Lab 08/02/16 1749  GLUCAP 124*    Microbiology: Results for orders placed or performed during the hospital encounter of 08/01/16  MRSA PCR Screening     Status: None   Collection Time: 08/01/16  5:58 PM  Result Value Ref Range Status   MRSA by PCR NEGATIVE NEGATIVE Final    Comment:        The GeneXpert MRSA Assay (FDA approved for NASAL specimens only), is one component of a comprehensive MRSA colonization surveillance program. It is not intended to diagnose MRSA infection nor to guide or monitor treatment for MRSA infections.     Coagulation Studies: No results for input(s): LABPROT, INR in the last 72 hours.  Imaging: Dg Chest 2 View  Result Date: 08/01/2016 CLINICAL DATA:  Fall. EXAM: CHEST  2 VIEW COMPARISON:  Chest x-ray 07/06/1999 report . FINDINGS: Prior CABG. Cardiomegaly with mild bilateral interstitial prominence consistent with CHF. No pleural effusion or pneumothorax. Degenerative changes and osteopenia thoracic spine. Mild lower thoracic vertebral body compression fractures, age undetermined. IMPRESSION: Prior CABG. Cardiomegaly with diffuse mild bilateral interstitial  prominence consistent with mild CHF. Electronically Signed   By: Maisie Fus  Register   On: 08/01/2016 13:07   Ct Head Wo Contrast  Result Date: 08/03/2016 CLINICAL DATA:  Subdural hematoma follow-up.  Dementia. EXAM: CT HEAD WITHOUT CONTRAST TECHNIQUE: Contiguous axial images were obtained from the base of the skull through the vertex without intravenous contrast. COMPARISON:  08/02/2016 FINDINGS: Brain: Small volume subarachnoid hemorrhage anteriorly on the right  unchanged. Right temporal subdural hematoma 8.3 mm, slightly improved from the prior study. Intraventricular hemorrhage unchanged. New new area of hemorrhage. Generalized atrophy with ventricular enlargement which is stable. Diffuse cerebral white matter hypodensity and basal ganglia chronic infarcts unchanged. Negative for acute infarct or mass. Vascular: Atherosclerotic calcification. Negative for hyperdense vessel Skull: Negative for fracture.  Right frontal scalp hematoma. Sinuses/Orbits: Small air-fluid level sphenoid sinus unchanged. Remaining sinuses clear Other: None IMPRESSION: Right temporal lobe subdural hematoma 8.3 mm appears slightly smaller. Mild subarachnoid hemorrhage stable. Intraventricular hemorrhage stable. No new area of hemorrhage since yesterday. Electronically Signed   By: Marlan Palau M.D.   On: 08/03/2016 09:31   Ct Head Wo Contrast  Result Date: 08/02/2016 CLINICAL DATA:  Fall yesterday with head injury. Follow-up subdural hematoma. EXAM: CT HEAD WITHOUT CONTRAST TECHNIQUE: Contiguous axial images were obtained from the base of the skull through the vertex without intravenous contrast. COMPARISON:  CT head 08/01/2016. FINDINGS: Brain: Subdural hematoma adjacent to the right temporal lobe has mildly enlarged, measuring up to 10 mm in thickness on image number 9 (previously 7 mm). Cortical contusions or subarachnoid hemorrhage along both frontal lobes is not significantly changed. There is slightly greater layering blood within both occipital horns. Ventriculomegaly, periventricular low-density and other scattered chronic small vessel ischemic changes are stable. There is no midline shift or evidence of acute stroke. Vascular: Diffuse intracranial vascular calcifications. Skull: No evidence of calvarial fracture. There is right frontal scalp and right periorbital soft tissue swelling. Sinuses/Orbits: The visualized paranasal sinuses, mastoid air cells and middle ears are clear. No  acute orbital findings. There is periorbital soft tissue swelling on the right. Postsurgical changes are present in both globes. Other: None. IMPRESSION: 1. Right temporal subdural hematoma has mildly enlarged from yesterday. 2. Mildly progressive intraventricular hemorrhage. Stable ventriculomegaly. 3. No midline shift or other significant changes. Electronically Signed   By: Carey Bullocks M.D.   On: 08/02/2016 11:23   Ct Head Wo Contrast  Result Date: 08/01/2016 CLINICAL DATA:  Pt to ED via EMS from [facility] for a fall this morning, EMS states per facility it was a mechanical witnessed fall and then had a vagal episode on the toilet. EXAM: CT HEAD WITHOUT CONTRAST CT MAXILLOFACIAL WITHOUT CONTRAST TECHNIQUE: Multidetector CT imaging of the head and maxillofacial structures were performed using the standard protocol without intravenous contrast. Multiplanar CT image reconstructions of the maxillofacial structures were also generated. COMPARISON:  None FINDINGS: CT HEAD FINDINGS Brain: There is intracranial hemorrhage. There is a small right temporal subdural hematoma measuring 6.5 mm in thickness. Small foci of hemorrhage are seen along the cortical margins of the frontal lobes anteriorly, consistent with parenchymal contusions. There is intraventricular hemorrhage. Hemorrhage is seen layering in the dependent portions of the lateral ventricle occipital horns and right temporal horn. There is hemorrhage within the right lateral ventricle anterior horn hand adjacent to the right septum pellucidum. A tiny focus of hemorrhage is seen along the anterior interhemispheric fissure. The ventricles are enlarged along the sulci reflecting moderate generalized atrophy. No convincing hydrocephalus. There  is no old lacune infarct in the left basal ganglia. Patchy white matter hypoattenuation is noted bilaterally consistent with moderate chronic microvascular ischemic change. There is no evidence of an ischemic infarct.  Vascular: No hyperdense vessel or unexpected calcification. Skull: No skull fracture.  No skull lesion. Other: Right periorbital soft tissue hemorrhage described below. CT MAXILLOFACIAL FINDINGS Osseous: Subtle nondisplaced fracture of the lateral right orbital wall, which could be acute or old. Mild depression of the floor of the right orbit which may reflect another acute fracture but could be chronic. No other evidence of a fracture. No bone lesions. Orbits: There is significant right preseptal periorbital hemorrhage extending to the right cheek and right lower forehead and temple region. No postseptal orbital hemorrhage. Globe is intact. Left globe and orbit are unremarkable. Sinuses: Clear sinuses, mastoid air cells and middle ear cavities. Soft tissues: No soft tissue masses or adenopathy. IMPRESSION: 1. Acute intracranial hemorrhage. There are small foci of cortical hemorrhage reflecting contusions along the anterior frontal lobes. There is a small right temporal subdural hematoma and there is a small amount of intraventricular hemorrhage. No mass effect or midline shift. 2. Nondisplaced fracture of the right lateral orbital wall and mildly depressed fracture of right orbital floor, both of which could be acute or chronic. A chronic etiology is supported given the lack of adjacent edema and lack of sinus fluid. No other evidence of a fracture. 3. Significant right preseptal periorbital hemorrhage extending to the right inferior forehead, right temple region right cheek. No evidence of injury to the right globe or postseptal orbit. Critical Value/emergent results were called by telephone at the time of interpretation on 08/01/2016 at 12:56 pm to Dr. Ernestina Columbia, who verbally acknowledged these results. Electronically Signed   By: Amie Portland M.D.   On: 08/01/2016 12:53   Ct Cervical Spine Wo Contrast  Result Date: 08/01/2016 CLINICAL DATA:  Fall EXAM: CT CERVICAL SPINE WITHOUT CONTRAST TECHNIQUE:  Multidetector CT imaging of the cervical spine was performed without intravenous contrast. Multiplanar CT image reconstructions were also generated. COMPARISON:  None. FINDINGS: Alignment: No static subluxation. Facets are aligned. Occipital condyles and the lateral masses of C1 and C2 are normally approximated. Skull base and vertebrae: No acute fracture. Soft tissues and spinal canal: No prevertebral fluid or swelling. No visible canal hematoma. Disc levels: There is multilevel severe facet hypertrophy and disc degeneration with uncovertebral hypertrophy. The left C3-C4 facets are fused. There is severe stenosis of the right C6-7 neural foramen. No bony spinal canal stenosis. Upper chest: No pneumothorax, pulmonary nodule or pleural effusion. There is aortic atherosclerosis. Other: Normal visualized paraspinal cervical soft tissues. IMPRESSION: 1. No acute fracture or static subluxation of the cervical spine. 2. 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 3.  Aortic Atherosclerosis (ICD10-I70.0). Electronically Signed   By: Deatra Robinson M.D.   On: 08/01/2016 13:46   Dg Hand Complete Left  Result Date: 08/01/2016 CLINICAL DATA:  Larey Seat and injured left hand. EXAM: LEFT HAND - COMPLETE 3+ VIEW COMPARISON:  None. FINDINGS: Moderate to advanced degenerative changes involving the DIP and PIP joints of the fingers. The MCP joints are maintained. The radiocarpal and intercarpal joint spaces are maintained. No wrist fracture is identified. There is a fracture involving the base of the thumb with probable intra-articular component at the [facility] joint. No associated dislocation. IMPRESSION: Comminuted intra-articular fracture involving the base of the thumb Willeen Cass fracture). No associated dislocation. Degenerative changes but no other fractures. Electronically Signed  By: Rudie Meyer M.D.   On: 08/01/2016 13:10   Ct Maxillofacial Wo Contrast  Result Date: 08/01/2016 CLINICAL DATA:   Pt to ED via EMS from [facility] for a fall this morning, EMS states per facility it was a mechanical witnessed fall and then had a vagal episode on the toilet. EXAM: CT HEAD WITHOUT CONTRAST CT MAXILLOFACIAL WITHOUT CONTRAST TECHNIQUE: Multidetector CT imaging of the head and maxillofacial structures were performed using the standard protocol without intravenous contrast. Multiplanar CT image reconstructions of the maxillofacial structures were also generated. COMPARISON:  None FINDINGS: CT HEAD FINDINGS Brain: There is intracranial hemorrhage. There is a small right temporal subdural hematoma measuring 6.5 mm in thickness. Small foci of hemorrhage are seen along the cortical margins of the frontal lobes anteriorly, consistent with parenchymal contusions. There is intraventricular hemorrhage. Hemorrhage is seen layering in the dependent portions of the lateral ventricle occipital horns and right temporal horn. There is hemorrhage within the right lateral ventricle anterior horn hand adjacent to the right septum pellucidum. A tiny focus of hemorrhage is seen along the anterior interhemispheric fissure. The ventricles are enlarged along the sulci reflecting moderate generalized atrophy. No convincing hydrocephalus. There is no old lacune infarct in the left basal ganglia. Patchy white matter hypoattenuation is noted bilaterally consistent with moderate chronic microvascular ischemic change. There is no evidence of an ischemic infarct. Vascular: No hyperdense vessel or unexpected calcification. Skull: No skull fracture.  No skull lesion. Other: Right periorbital soft tissue hemorrhage described below. CT MAXILLOFACIAL FINDINGS Osseous: Subtle nondisplaced fracture of the lateral right orbital wall, which could be acute or old. Mild depression of the floor of the right orbit which may reflect another acute fracture but could be chronic. No other evidence of a fracture. No bone lesions. Orbits: There is significant  right preseptal periorbital hemorrhage extending to the right cheek and right lower forehead and temple region. No postseptal orbital hemorrhage. Globe is intact. Left globe and orbit are unremarkable. Sinuses: Clear sinuses, mastoid air cells and middle ear cavities. Soft tissues: No soft tissue masses or adenopathy. IMPRESSION: 1. Acute intracranial hemorrhage. There are small foci of cortical hemorrhage reflecting contusions along the anterior frontal lobes. There is a small right temporal subdural hematoma and there is a small amount of intraventricular hemorrhage. No mass effect or midline shift. 2. Nondisplaced fracture of the right lateral orbital wall and mildly depressed fracture of right orbital floor, both of which could be acute or chronic. A chronic etiology is supported given the lack of adjacent edema and lack of sinus fluid. No other evidence of a fracture. 3. Significant right preseptal periorbital hemorrhage extending to the right inferior forehead, right temple region right cheek. No evidence of injury to the right globe or postseptal orbit. Critical Value/emergent results were called by telephone at the time of interpretation on 08/01/2016 at 12:56 pm to Dr. Ernestina Columbia, who verbally acknowledged these results. Electronically Signed   By: Amie Portland M.D.   On: 08/01/2016 12:53    Medications:  I have reviewed the patient's current medications. Scheduled: . citalopram  40 mg Oral Daily  . donepezil  10 mg Oral Daily  . famotidine  20 mg Oral Daily  . lactase  6,000 Units Oral TID WC  . lisinopril  10 mg Oral QHS  . memantine  14 mg Oral Daily  . metoprolol succinate  50 mg Oral Daily  . multivitamin with minerals  1 tablet Oral Daily  . [START ON 08/04/2016]  Vitamin D (Ergocalciferol)  50,000 Units Oral Weekly    Assessment/Plan: Agree with current management.  No further imaging indicated at this time.     LOS: 2 days   Thana FarrLeslie Lavera Vandermeer, MD Neurology [phone] 08/03/2016   10:43 AM

## 2016-08-03 NOTE — Progress Notes (Signed)
[facility] [place] [facility]   PATIENT NAME: Marissa Colon    MR#:  [id number]  DATE OF BIRTH:  [birth year]  SUBJECTIVE:  CHIEF COMPLAINT:   Chief Complaint  Patient presents with  . Fall   Patient is 83 year old Caucasian female with a history significant for history of asthma, dementia, vitamin D deficiency, constipation, hypertension, gastro-esophageal reflux disease, who presents to the hospital with complaints of fall. She hit her head and lost consciousness. She had an episode of nausea. On arrival to emergency room, she was noted to have a subcutaneous hematoma periorbitally, right frontal scalp, right subdural hematoma, cortical contusion, or subarachnoid blood around the frontal lobes, blood in the both occipital horns of lateral ventricles. Patient was seen by neurosurgeon, no intervention was recommended, conservative therapy. Patient feels good today, denies any headache, discomfort, able to move upper, lower extremities, no weakness, no changes in vision. Significant changes today, denies any pain, headaches or visual problems, swelling is subsiding Review of Systems  Unable to perform ROS: Dementia    VITAL SIGNS: Blood pressure (!) 130/53, pulse 68, temperature 98.8 F (37.1 C), temperature source Oral, resp. rate 19, height 5\' 2"  (1.575 m), weight 68.5 kg (151 lb), SpO2 97 %.  PHYSICAL EXAMINATION:   GENERAL:  83 y.o.-year-old patient lying in the bed with no acute distress. Right frontal, right neck area, right periorbital hematoma . Periorbital swelling is subsiding EYES: Pupils equal, round, reactive to light and accommodation. No scleral icterus. Extraocular muscles intact.  HEENT: Head atraumatic, normocephalic. Oropharynx and nasopharynx clear.  NECK:  Supple, no jugular venous distention. No thyroid enlargement, no tenderness.  LUNGS: Normal breath sounds bilaterally, no wheezing, rales,rhonchi or crepitation. No use of accessory  muscles of respiration.  CARDIOVASCULAR: S1, S2 normal. No murmurs, rubs, or gallops.  ABDOMEN: Soft, nontender, nondistended. Bowel sounds present. No organomegaly or mass.  EXTREMITIES: No pedal edema, cyanosis, or clubbing.  NEUROLOGIC: Cranial nerves II through XII are intact. Muscle strength 5/5 in all extremities. Sensation intact. Gait not checked.  PSYCHIATRIC: The patient is alert , able to answer some questions appropriately, able to follow some commands.  SKIN: No obvious rash, lesion, or ulcer.   ORDERS/RESULTS REVIEWED:   CBC  Recent Labs Lab 08/01/16 1306 08/02/16 0453 08/03/16 0523  WBC 16.6* 12.6* 10.1  HGB 12.8 10.7* 9.7*  HCT 38.7 32.2* 28.8*  PLT 213 185 147*  MCV 94.1 93.7 94.8  MCH 31.2 31.1 31.9  MCHC 33.2 33.2 33.7  RDW 14.1 14.2 14.1  LYMPHSABS 0.8*  --   --   MONOABS 0.9  --   --   EOSABS 0.0  --   --   BASOSABS 0.0  --   --    ------------------------------------------------------------------------------------------------------------------  Chemistries   Recent Labs Lab 08/01/16 1306 08/02/16 0453  NA 138 141  K 3.5 4.0  CL 101 106  CO2 27 29  GLUCOSE 146* 119*  BUN 18 19  CREATININE 1.04* 0.82  CALCIUM 9.6 9.4   ------------------------------------------------------------------------------------------------------------------ estimated creatinine clearance is 41.4 mL/min (by C-G formula based on SCr of 0.82 mg/dL). ------------------------------------------------------------------------------------------------------------------ No results for input(s): TSH, T4TOTAL, T3FREE, THYROIDAB in the last 72 hours.  Invalid input(s): FREET3  Cardiac Enzymes  Recent Labs Lab 08/01/16 1306  TROPONINI <0.03   ------------------------------------------------------------------------------------------------------------------ Invalid input(s):  POCBNP ---------------------------------------------------------------------------------------------------------------  RADIOLOGY: Ct Head Wo Contrast  Result Date: 08/03/2016 CLINICAL DATA:  Subdural hematoma follow-up.  Dementia. EXAM: CT HEAD WITHOUT CONTRAST TECHNIQUE: Contiguous axial  images were obtained from the base of the skull through the vertex without intravenous contrast. COMPARISON:  08/02/2016 FINDINGS: Brain: Small volume subarachnoid hemorrhage anteriorly on the right unchanged. Right temporal subdural hematoma 8.3 mm, slightly improved from the prior study. Intraventricular hemorrhage unchanged. New new area of hemorrhage. Generalized atrophy with ventricular enlargement which is stable. Diffuse cerebral white matter hypodensity and basal ganglia chronic infarcts unchanged. Negative for acute infarct or mass. Vascular: Atherosclerotic calcification. Negative for hyperdense vessel Skull: Negative for fracture.  Right frontal scalp hematoma. Sinuses/Orbits: Small air-fluid level sphenoid sinus unchanged. Remaining sinuses clear Other: None IMPRESSION: Right temporal lobe subdural hematoma 8.3 mm appears slightly smaller. Mild subarachnoid hemorrhage stable. Intraventricular hemorrhage stable. No new area of hemorrhage since yesterday. Electronically Signed   By: Marlan Palau M.D.   On: 08/03/2016 09:31   Ct Head Wo Contrast  Result Date: 08/02/2016 CLINICAL DATA:  Fall yesterday with head injury. Follow-up subdural hematoma. EXAM: CT HEAD WITHOUT CONTRAST TECHNIQUE: Contiguous axial images were obtained from the base of the skull through the vertex without intravenous contrast. COMPARISON:  CT head 08/01/2016. FINDINGS: Brain: Subdural hematoma adjacent to the right temporal lobe has mildly enlarged, measuring up to 10 mm in thickness on image number 9 (previously 7 mm). Cortical contusions or subarachnoid hemorrhage along both frontal lobes is not significantly changed. There is slightly  greater layering blood within both occipital horns. Ventriculomegaly, periventricular low-density and other scattered chronic small vessel ischemic changes are stable. There is no midline shift or evidence of acute stroke. Vascular: Diffuse intracranial vascular calcifications. Skull: No evidence of calvarial fracture. There is right frontal scalp and right periorbital soft tissue swelling. Sinuses/Orbits: The visualized paranasal sinuses, mastoid air cells and middle ears are clear. No acute orbital findings. There is periorbital soft tissue swelling on the right. Postsurgical changes are present in both globes. Other: None. IMPRESSION: 1. Right temporal subdural hematoma has mildly enlarged from yesterday. 2. Mildly progressive intraventricular hemorrhage. Stable ventriculomegaly. 3. No midline shift or other significant changes. Electronically Signed   By: Carey Bullocks M.D.   On: 08/02/2016 11:23    EKG:  Orders placed or performed during the hospital encounter of 08/01/16  . EKG 12-Lead  . EKG 12-Lead    ASSESSMENT AND PLAN:  Active Problems:   Intracranial hemorrhage (HCC)  #1. Intracranial hemorrhage, status post desmopressin intravenous administration and platelet transfusion, repeated CT scan today revealed improvement, the patient is clinically stable, patient is being followed by neurologist, who recommens to continue the same conservative therapy. Patient was seen by neurosurgeon, no intervention was recommended. Discussed with the patient's son #2. Fever, likely central, continue Tylenol when necessary, white blood cell count is normal, get urinalysis and initiate antibiotic therapy if abnormal after cultures were taken #3. Hyperglycemia, Hgb a1c was 5.3, no diabetes #4 acute renal insufficiency, resolved #5. Leukocytosis, improved, follow in the morning #6. Anemia, follow in the morning, transfuse as needed #7. Left hand basilar first metacarpal fracture, patient was seen by  orthopedic surgeon, thumb splint was applied, follow-up in 2 weeks with recommended to decide if continue with the cast or removable brace, continue pain medications as needed Management plans discussed with the patient, family and they are in agreement.   DRUG ALLERGIES: No Known Allergies  CODE STATUS:     Code Status Orders        Start     Ordered   08/01/16 1753  Do not attempt resuscitation (DNR)  Continuous  Question Answer Comment  In the event of cardiac or respiratory ARREST Do not call a "code blue"   In the event of cardiac or respiratory ARREST Do not perform Intubation, CPR, defibrillation or ACLS   In the event of cardiac or respiratory ARREST Use medication by any route, position, wound care, and other measures to relive pain and suffering. May use oxygen, suction and manual treatment of airway obstruction as needed for comfort.   Comments rn may pronounce      08/01/16 1752    Code Status History    Date Active Date Inactive Code Status Order ID Comments User Context   08/01/2016  1:46 PM 08/01/2016  5:52 PM DNR [id number]  Willy Eddyobinson, Patrick, MD ED    Advance Directive Documentation     Most Recent Value  Type of Advance Directive  Healthcare Power of Attorney, Out of facility DNR (pink MOST or yellow form)  Pre-existing out of facility DNR order (yellow form or pink MOST form)  Yellow form placed in chart (order not valid for inpatient use)  "MOST" Form in Place?  -      TOTAL TIME TAKING CARE OF THIS PATIENT: 40 minutes.  Discussed with patient's son again today  Albino Bufford M.D on 08/03/2016 at 3:49 PM  Between 7am to 6pm - Pager - [phone]  After 6pm go to [email or web address] - password [email or web address]  [facility]io [facility]  Office  [phone]  CC: Primary care physician; Patient, No Pcp Per

## 2016-08-03 NOTE — Progress Notes (Signed)
Pt unable to provide urine specimen.  Had pt sit on bedpan but pt unable to void.  Spoke with Dr Anne HahnWillis and received order to in/out straight cath.  Obtained specimen for UA/C&S. Henriette CombsSarah Lexxi Koslow RN

## 2016-08-04 DIAGNOSIS — S0990XA Unspecified injury of head, initial encounter: Secondary | ICD-10-CM

## 2016-08-04 DIAGNOSIS — N289 Disorder of kidney and ureter, unspecified: Secondary | ICD-10-CM

## 2016-08-04 DIAGNOSIS — D72829 Elevated white blood cell count, unspecified: Secondary | ICD-10-CM

## 2016-08-04 DIAGNOSIS — S62213A Bennett's fracture, unspecified hand, initial encounter for closed fracture: Secondary | ICD-10-CM

## 2016-08-04 DIAGNOSIS — S065X9A Traumatic subdural hemorrhage with loss of consciousness of unspecified duration, initial encounter: Secondary | ICD-10-CM

## 2016-08-04 DIAGNOSIS — S065XAA Traumatic subdural hemorrhage with loss of consciousness status unknown, initial encounter: Secondary | ICD-10-CM

## 2016-08-04 DIAGNOSIS — R739 Hyperglycemia, unspecified: Secondary | ICD-10-CM

## 2016-08-04 DIAGNOSIS — D649 Anemia, unspecified: Secondary | ICD-10-CM

## 2016-08-04 DIAGNOSIS — N390 Urinary tract infection, site not specified: Secondary | ICD-10-CM

## 2016-08-04 DIAGNOSIS — R509 Fever, unspecified: Secondary | ICD-10-CM

## 2016-08-04 MED ORDER — CEPHALEXIN 500 MG PO CAPS: 500.0000 mg | ORAL_CAPSULE | Freq: Two times a day (BID) | ORAL | 0 refills | Status: AC

## 2016-08-04 MED ORDER — ONDANSETRON HCL 4 MG PO TABS: 4.0000 mg | ORAL_TABLET | Freq: Four times a day (QID) | ORAL | 0 refills | Status: AC | PRN

## 2016-08-04 NOTE — Clinical Social Work Note (Addendum)
Clinical Social Work Assessment  Patient Details  Name: Marissa Colon MRN: [id number] Date of Birth: 1923/10/28  Date of referral:  08/04/16               Reason for consult:  Facility Placement                Permission sought to share information with:  Family Supports Permission granted to share information::  Yes, Verbal Permission Granted  Name::     Marissa Colon,Marissa Colon [phone]   Agency::  SNF admissions  Relationship::     Contact Information:     Housing/Transportation Living arrangements for the past 2 months:  Skilled Nursing Facility Source of Information:  Adult Children Patient Interpreter Needed:  None Criminal Activity/Legal Involvement Pertinent to Current Situation/Hospitalization:  No - Comment as needed Significant Relationships:  Adult Children Lives with:  Facility Resident Do you feel safe going back to the place where you live?  Yes Need for family participation in patient care:  Yes (Comment)  Care giving concerns:  Patient's family did not have any concerns about returning back to SNF.  Social Worker assessment / plan:  Patient is a 83 year old female who is from [facility].  Patient has dementia assessment was completed by speaking with patient's Colon.  Patient has been at Memory care SNF for over a year, patient's Colon was explained role of social worker and the process for returning to SNF.  Patient's Colon did not express any questions or concerns and is appreciative of help given.  Employment status:  Retired Health and safety inspectornsurance information:  Medicare PT Recommendations:  Skilled Nursing Facility Information / Referral to community resources:  Skilled Nursing Facility  Patient/Family's Response to care:  Patient and family are in agreement to returning back to [facility].  Patient/Family's Understanding of and Emotional Response to Diagnosis, Current Treatment, and Prognosis:  Patient's family is looking forward to having  patient return back to SNF.  Emotional Assessment Appearance:  Appears stated age Attitude/Demeanor/Rapport:    Affect (typically observed):  Accepting, Appropriate, Calm, Stable Orientation:  Oriented to Self Alcohol / Substance use:  Not Applicable Psych involvement (Current and /or in the community):  No (Comment)  Discharge Needs  Concerns to be addressed:    Readmission within the last 30 days:  No Current discharge risk:  None Barriers to Discharge:  No Barriers Identified   Darleene Cleavernterhaus, Alaena Strader R, LCSWA 08/04/2016, 3:58 PM

## 2016-08-04 NOTE — NC FL2 (Signed)
[place] MEDICAID FL2 LEVEL OF CARE SCREENING TOOL     IDENTIFICATION  Patient Name: Marissa Colon Birthdate: 01/07/1924 Sex: female Admission Date (Current Location): 08/01/2016  [place] and [place] Number:  [address] [id number] R Facility and Address:  [facility], [address], [place], [place] [address]      Provider Number: [id number]  Attending Physician Name and Address:  Katharina Caper, MD  Relative Name and Phone Number:  Clementeen Graham  [phone] or     Current Level of Care: Hospital Recommended Level of Care: Memory Care, Skilled Nursing Facility Prior Approval Number:    Date Approved/Denied:   PASRR Number: [id number]  Discharge Plan: SNF    Current Diagnoses: Patient Active Problem List   Diagnosis Date Noted  . Subdural hematoma, acute (HCC) 08/04/2016  . Fever 08/04/2016  . Acute lower UTI 08/04/2016  . Leukocytosis 08/04/2016  . Hyperglycemia 08/04/2016  . Acute renal insufficiency 08/04/2016  . Anemia 08/04/2016  . Bennett's fracture 08/04/2016  . Intracranial hemorrhage (HCC) 08/01/2016    Orientation RESPIRATION BLADDER Height & Weight     Self  O2 (1L) Incontinent Weight: 151 lb (68.5 kg) Height:  5\' 2"  (157.5 cm)  BEHAVIORAL SYMPTOMS/MOOD NEUROLOGICAL BOWEL NUTRITION STATUS      Incontinent Diet (Regular diet)  AMBULATORY STATUS COMMUNICATION OF NEEDS Skin   Limited Assist Verbally Normal                       Personal Care Assistance Level of Assistance  Bathing, Feeding, Dressing Bathing Assistance: Limited assistance Feeding assistance: Maximum assistance Dressing Assistance: Maximum assistance     Functional Limitations Info  Sight, Speech, Hearing Sight Info: Adequate Hearing Info: Adequate Speech Info: Adequate    SPECIAL CARE FACTORS FREQUENCY                       Contractures Contractures Info: Not present    Additional Factors Info  Code Status, Allergies, Psychotropic  Code Status Info: DNR Allergies Info: NKA Psychotropic Info: citalopram (CELEXA) tablet 40 mg          Current Medications (08/04/2016):  This is the current hospital active medication list Current Facility-Administered Medications  Medication Dose Route Frequency Provider Last Rate Last Dose  . acetaminophen (TYLENOL) tablet 500 mg  500 mg Oral Q6H PRN Gouru, Aruna, MD   500 mg at 08/02/16 2027  . citalopram (CELEXA) tablet 40 mg  40 mg Oral Daily Gouru, Aruna, MD   40 mg at 08/04/16 0953  . donepezil (ARICEPT) tablet 10 mg  10 mg Oral Daily Gouru, Aruna, MD   10 mg at 08/04/16 1013  . famotidine (PEPCID) tablet 20 mg  20 mg Oral Daily Gouru, Aruna, MD   20 mg at 08/04/16 0953  . lactase (LACTAID) tablet 6,000 Units  6,000 Units Oral TID WC Gouru, Aruna, MD   6,000 Units at 08/04/16 0800  . lisinopril (PRINIVIL,ZESTRIL) tablet 10 mg  10 mg Oral QHS Gouru, Aruna, MD   10 mg at 08/03/16 2313  . memantine (NAMENDA XR) 24 hr capsule 14 mg  14 mg Oral Daily Gouru, Aruna, MD   14 mg at 08/04/16 1013  . metoprolol succinate (TOPROL-XL) 24 hr tablet 50 mg  50 mg Oral Daily Gouru, Aruna, MD   50 mg at 08/04/16 0953  . multivitamin with minerals tablet 1 tablet  1 tablet Oral Daily Gouru, Aruna, MD   1 tablet at 08/04/16 0953  .  nitroGLYCERIN (NITROSTAT) SL tablet 0.4 mg  0.4 mg Sublingual Q5 min PRN Gouru, Aruna, MD      . ondansetron (ZOFRAN) tablet 4 mg  4 mg Oral Q6H PRN Gouru, Aruna, MD       Or  . ondansetron (ZOFRAN) injection 4 mg  4 mg Intravenous Q6H PRN Gouru, Aruna, MD      . polyethylene glycol (MIRALAX / GLYCOLAX) packet 17 g  17 g Oral Daily PRN Gouru, Aruna, MD      . Vitamin D (Ergocalciferol) (DRISDOL) capsule 50,000 Units  50,000 Units Oral Weekly Ramonita LabGouru, Aruna, MD   50,000 Units at 08/04/16 1012     Discharge Medications: Please see discharge summary for a list of discharge medications.  Relevant Imaging Results:  Relevant Lab Results:   Additional Information SSN  [id number]  Darleene Cleavernterhaus, Neasia Fleeman R, [place]WA

## 2016-08-04 NOTE — Discharge Summary (Signed)
[facility] -  at [facility]   PATIENT NAME: Marissa Colon    MR#:  [id number]  DATE OF BIRTH:  09-01-1923  DATE OF ADMISSION:  08/01/2016 ADMITTING PHYSICIAN: Ramonita Lab, MD  DATE OF DISCHARGE: No discharge date for patient encounter.  PRIMARY CARE PHYSICIAN: Patient, No Pcp Per     ADMISSION DIAGNOSIS:  Injury of head, initial encounter [S09.90XA] Subdural hematoma without coma, with loss of consciousness of 30 minutes or less, initial encounter (HCC) [S06.5X1A] Closed Bennett's fracture of left thumb, initial encounter [S62.212A]  DISCHARGE DIAGNOSIS:  Active Problems:   Intracranial hemorrhage (HCC)   Subdural hematoma, acute (HCC)   Fever   Acute lower UTI   Leukocytosis   Hyperglycemia   Acute renal insufficiency   Anemia   Bennett's fracture   SECONDARY DIAGNOSIS:   Past Medical History:  Diagnosis Date  . Alzheimer's disease   . Alzheimer's disease   . Avitaminosis D   . CN (constipation)   . Esophageal reflux   . Essential hypertension, malignant   . Heart failure, unspecified (HCC)   . Senile arthritis   . 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 She hit her head and lost consciousness. She had an episode of nausea. On arrival to emergency room, she was noted to have a subcutaneous hematoma periorbitally, right frontal scalp, right subdural hematoma, cortical contusion, or subarachnoid blood around the frontal lobes, blood in the both occipital horns of lateral ventricles. Patient was seen by neurosurgeon, no intervention was recommended, conservative therapy. Patient feels good today, denies any headache, discomfort, able to move upper, lower extremities, no  weakness, no changes in vision.. Denies any pain, headaches or visual problems, swelling is subsiding. Patient was followed by neurologist while in the hospital, was felt to be stable clinically and recommended to be discharged to skilled nursing facility for rehabilitation. Discussion by problem: #1. Right temporal lobe subdural hematoma, Intracranial hemorrhage, status post intravenous desmopressin administration and platelet transfusion, repeated CT scan today revealed some improvement, the patient is clinically stable, patient was  followed by neurologist, who recommened to continue the same conservative therapy and discharged to skilled nursing facility for rehabilitation. Patient was seen by neurosurgeon, no intervention was recommended.  #2. Fever, likely central versus due to UTI, since patient's urinalysis revealed pyuria, urine cultures are taken, patient is initiated on antibiotic therapy, fever subsided, continue Tylenol when necessary, white blood cell count normalized  #3. Hyperglycemia, Hgb a1c was 5.3, no diabetes #4 acute renal insufficiency, resolved #5. Leukocytosis, resolved #6. Anemia, hemoglobin level has been declining since admission to 9.7 on the day of discharge, it is recommended to follow hemoglobin level closely, no indications for transfusion at present.  #7. Left hand basilar first metacarpal fracture, patient was seen by orthopedic surgeon, thumb splint was applied, follow-up in 2 weeks with recommended to decide if continue with the cast or removable brace, continue pain medications as needed #8 pyuria, suspected urinary tract infection, continue Keflex for 3 days to complete a course #9 hypotension, relative, hold blood pressure medications, resume them as needed DISCHARGE CONDITIONS:   Stable  CONSULTS OBTAINED:  Treatment Team:  Kennedy Bucker, MD Thana Farr, MD  DRUG ALLERGIES:  No Known Allergies  DISCHARGE MEDICATIONS:   Current Discharge  Medication List    START taking these medications   Details  ondansetron (ZOFRAN) 4 MG tablet Take 1 tablet (4 mg total) by mouth every 6 (six) hours as needed for nausea. Qty: 20 tablet, Refills: 0      CONTINUE these medications which have NOT CHANGED   Details  acetaminophen (TYLENOL) 500 MG tablet Take 500 mg by mouth every 6 (six) hours as needed.    bismuth subsalicylate (KAOPECTATE) 262 MG/15ML suspension Take 30 mLs by mouth every 4 (four) hours as needed.    citalopram (CELEXA) 40 MG tablet Take 40 mg by mouth daily.    CRANBERRY PO Take 1 tablet by mouth 2 (two) times daily.    donepezil (ARICEPT) 10 MG tablet Take 10 mg by mouth daily.     ergocalciferol (VITAMIN D2) 50000 units capsule Take 50,000 Units by mouth once a week.    lactase (LACTAID) 3000 units tablet Take 6,000 Units by mouth 3 (three) times daily with meals.    memantine (NAMENDA XR) 14 MG CP24 24 hr capsule Take 14 mg by mouth daily.    Multiple Vitamin (MULTIVITAMIN WITH MINERALS) TABS tablet Take 1 tablet by mouth daily.    naproxen (NAPROSYN) 500 MG tablet Take 500 mg by mouth 2 (two) times daily as needed.    nitroGLYCERIN (NITROSTAT) 0.4 MG SL tablet Place 0.4 mg under the tongue every 5 (five) minutes as needed for chest pain.    polyethylene glycol (MIRALAX / GLYCOLAX) packet Take 17 g by mouth daily as needed.     ranitidine (ZANTAC) 150 MG tablet Take 150 mg by mouth daily.    vitamin C (ASCORBIC ACID) 500 MG tablet Take 500 mg by mouth daily.      STOP taking these medications     amLODipine (NORVASC) 5 MG tablet      aspirin 81 MG tablet      clopidogrel (PLAVIX) 75 MG tablet      lisinopril (PRINIVIL,ZESTRIL) 10 MG tablet      metoprolol succinate (TOPROL-XL) 50 MG 24 hr tablet          DISCHARGE INSTRUCTIONS:    The patient is to follow-up with neurologist and orthopedic surgeon as outpatient  If you experience worsening of your admission symptoms, develop shortness  of breath, life threatening emergency, suicidal or homicidal thoughts you must seek medical attention immediately by calling 911 or calling your MD immediately  if symptoms less severe.  You Must read complete instructions/literature along with all the possible adverse reactions/side effects for all the Medicines you take and that have been prescribed to you. Take any new Medicines after you have completely understood and accept all the possible adverse reactions/side effects.   Please note  You were cared for by a hospitalist during your hospital stay. If you have any questions about your discharge medications or the care you received while you were in the hospital after you are discharged, you can call the unit and asked to speak with the hospitalist on call if the hospitalist that took care of you is not available. Once you are discharged, your primary care physician will handle any further medical issues. Please note that NO REFILLS for any discharge medications will be authorized once you are discharged, as it is imperative that you return to your primary care physician (or establish a relationship with a primary care physician if you do not have one) for your aftercare needs so that they can reassess your need for medications  and monitor your lab values.    Today   CHIEF COMPLAINT:   Chief Complaint  Patient presents with  . Fall    HISTORY OF PRESENT ILLNESS:     VITAL SIGNS:  Blood pressure (!) 117/39, pulse 68, temperature 98.2 F (36.8 C), temperature source Oral, resp. rate 17, height 5\' 2"  (1.575 m), weight 68.5 kg (151 lb), SpO2 96 %.  I/O:    Intake/Output Summary (Last 24 hours) at 08/04/16 1117 Last data filed at 08/04/16 1025  Gross per 24 hour  Intake              360 ml  Output              400 ml  Net              -40 ml    PHYSICAL EXAMINATION:  GENERAL:  83 y.o.-year-old patient lying in the bed with no acute distress.  EYES: Pupils equal, round, reactive  to light and accommodation. No scleral icterus. Extraocular muscles intact.  HEENT: Head atraumatic, normocephalic. Oropharynx and nasopharynx clear.  NECK:  Supple, no jugular venous distention. No thyroid enlargement, no tenderness.  LUNGS: Normal breath sounds bilaterally, no wheezing, rales,rhonchi or crepitation. No use of accessory muscles of respiration.  CARDIOVASCULAR: S1, S2 normal. No murmurs, rubs, or gallops.  ABDOMEN: Soft, non-tender, non-distended. Bowel sounds present. No organomegaly or mass.  EXTREMITIES: No pedal edema, cyanosis, or clubbing.  NEUROLOGIC: Cranial nerves II through XII are intact. Muscle strength 5/5 in all extremities. Sensation intact. Gait not checked.  PSYCHIATRIC: The patient is alert and oriented x 3.  SKIN: No obvious rash, lesion, or ulcer.   DATA REVIEW:   CBC  Recent Labs Lab 08/03/16 0523  WBC 10.1  HGB 9.7*  HCT 28.8*  PLT 147*    Chemistries   Recent Labs Lab 08/02/16 0453  NA 141  K 4.0  CL 106  CO2 29  GLUCOSE 119*  BUN 19  CREATININE 0.82  CALCIUM 9.4    Cardiac Enzymes  Recent Labs Lab 08/01/16 1306  TROPONINI <0.03    Microbiology Results  Results for orders placed or performed during the hospital encounter of 08/01/16  MRSA PCR Screening     Status: None   Collection Time: 08/01/16  5:58 PM  Result Value Ref Range Status   MRSA by PCR NEGATIVE NEGATIVE Final    Comment:        The GeneXpert MRSA Assay (FDA approved for NASAL specimens only), is one component of a comprehensive MRSA colonization surveillance program. It is not intended to diagnose MRSA infection nor to guide or monitor treatment for MRSA infections.     RADIOLOGY:  Ct Head Wo Contrast  Result Date: 08/03/2016 CLINICAL DATA:  Subdural hematoma follow-up.  Dementia. EXAM: CT HEAD WITHOUT CONTRAST TECHNIQUE: Contiguous axial images were obtained from the base of the skull through the vertex without intravenous contrast. COMPARISON:   08/02/2016 FINDINGS: Brain: Small volume subarachnoid hemorrhage anteriorly on the right unchanged. Right temporal subdural hematoma 8.3 mm, slightly improved from the prior study. Intraventricular hemorrhage unchanged. New new area of hemorrhage. Generalized atrophy with ventricular enlargement which is stable. Diffuse cerebral white matter hypodensity and basal ganglia chronic infarcts unchanged. Negative for acute infarct or mass. Vascular: Atherosclerotic calcification. Negative for hyperdense vessel Skull: Negative for fracture.  Right frontal scalp hematoma. Sinuses/Orbits: Small air-fluid level sphenoid sinus unchanged. Remaining sinuses clear Other: None IMPRESSION: Right temporal lobe subdural hematoma 8.3 mm  appears slightly smaller. Mild subarachnoid hemorrhage stable. Intraventricular hemorrhage stable. No new area of hemorrhage since yesterday. Electronically Signed   By: Marlan Palauharles  Clark M.D.   On: 08/03/2016 09:31    EKG:   Orders placed or performed during the hospital encounter of 08/01/16  . EKG 12-Lead  . EKG 12-Lead      Management plans discussed with the patient, family and they are in agreement.  CODE STATUS:     Code Status Orders        Start     Ordered   08/01/16 1753  Do not attempt resuscitation (DNR)  Continuous    Question Answer Comment  In the event of cardiac or respiratory ARREST Do not call a "code blue"   In the event of cardiac or respiratory ARREST Do not perform Intubation, CPR, defibrillation or ACLS   In the event of cardiac or respiratory ARREST Use medication by any route, position, wound care, and other measures to relive pain and suffering. May use oxygen, suction and manual treatment of airway obstruction as needed for comfort.   Comments rn may pronounce      08/01/16 1752    Code Status History    Date Active Date Inactive Code Status Order ID Comments User Context   08/01/2016  1:46 PM 08/01/2016  5:52 PM DNR [id number]  Willy Eddyobinson,  Patrick, MD ED    Advance Directive Documentation     Most Recent Value  Type of Advance Directive  Healthcare Power of Attorney, Out of facility DNR (pink MOST or yellow form)  Pre-existing out of facility DNR order (yellow form or pink MOST form)  Yellow form placed in chart (order not valid for inpatient use)  "MOST" Form in Place?  -      TOTAL TIME TAKING CARE OF THIS PATIENT: 40 minutes.    Katharina CaperVAICKUTE,Barak Bialecki M.D on 08/04/2016 at 11:17 AM  Between 7am to 6pm - Pager - [phone]  After 6pm go to [email or web address] - password [email or web address]  [facility]io [facility] [facility]  Office  [phone]  CC: Primary care physician; Patient, No Pcp Per

## 2016-08-04 NOTE — Clinical Social Work Note (Signed)
Patient to be d[facility] today [facility].  Patient and family agreeable to plans will transport via son's car RN to call report.  Windell MouldingEric Kailan Laws, MSW, Theresia MajorsLCSWA [phone]

## 2016-08-04 NOTE — Care Management Important Message (Signed)
Important Message  Patient Details  Name: Marissa Colon MRN: [id number] Date of Birth: 07-24-1923   Medicare Important Message Given:  Yes    Gwenette GreetBrenda S Roddy Bellamy, RN 08/04/2016, 8:07 AM

## 2016-08-04 NOTE — Progress Notes (Signed)
Subjective: Patient awake and alert.  Answers questions and no acute changes.    Objective: Current vital signs: BP (!) 117/39 (BP Location: Right Arm)   Pulse 68   Temp 98.2 F (36.8 C) (Oral)   Resp 17   Ht 5\' 2"  (1.575 m)   Wt 68.5 kg (151 lb)   LMP  (LMP Unknown)   SpO2 96%   BMI 27.62 kg/m  Vital signs in last 24 hours: Temp:  [98.2 F (36.8 C)-98.8 F (37.1 C)] 98.2 F (36.8 C) (06/29 0310) Pulse Rate:  [68-81] 68 (06/29 0310) Resp:  [17] 17 (06/29 0310) BP: (117-139)/(39-57) 117/39 (06/29 0310) SpO2:  [95 %-97 %] 96 % (06/29 0310)  Intake/Output from previous day: 06/28 0701 - 06/29 0700 In: 240 [P.O.:240] Out: 400 [Urine:400] Intake/Output this shift: Total I/O In: 120 [P.O.:120] Out: -  Nutritional status: Diet regular Room service appropriate? Yes; Fluid consistency: Thin  Neurologic Exam: Mental Status: Alert.  Pleasant.  Not oriented.  Follows simple commands.  Speech dysarthric but fluent. Cranial Nerves: II: Discs flat bilaterally; Visual Handshoe grossly normal, pupils equal, round, reactive to light and accommodation III,IV, VI: right ptosis, extra-ocular motions intact bilaterally V,VII: smile symmetric, facial light touch sensation normal bilaterally VIII: hearing normal bilaterally IX,X: gag reflex present XI: bilateral shoulder shrug XII: midline tongue extension Motor: Lifts all extremities strongly against gravity with mild decrease in strength in the LLE.     Lab Results: Basic Metabolic Panel:  Recent Labs Lab 08/01/16 1306 08/02/16 0453  NA 138 141  K 3.5 4.0  CL 101 106  CO2 27 29  GLUCOSE 146* 119*  BUN 18 19  CREATININE 1.04* 0.82  CALCIUM 9.6 9.4    Liver Function Tests: No results for input(s): AST, ALT, ALKPHOS, BILITOT, PROT, ALBUMIN in the last 168 hours. No results for input(s): LIPASE, AMYLASE in the last 168 hours. No results for input(s): AMMONIA in the last 168 hours.  CBC:  Recent Labs Lab 08/01/16 1306  08/02/16 0453 08/03/16 0523  WBC 16.6* 12.6* 10.1  NEUTROABS 14.9*  --   --   HGB 12.8 10.7* 9.7*  HCT 38.7 32.2* 28.8*  MCV 94.1 93.7 94.8  PLT 213 185 147*    Cardiac Enzymes:  Recent Labs Lab 08/01/16 1306  TROPONINI <0.03    Lipid Panel: No results for input(s): CHOL, TRIG, HDL, CHOLHDL, VLDL, LDLCALC in the last 168 hours.  CBG:  Recent Labs Lab 08/02/16 1749  GLUCAP 124*    Microbiology: Results for orders placed or performed during the hospital encounter of 08/01/16  MRSA PCR Screening     Status: None   Collection Time: 08/01/16  5:58 PM  Result Value Ref Range Status   MRSA by PCR NEGATIVE NEGATIVE Final    Comment:        The GeneXpert MRSA Assay (FDA approved for NASAL specimens only), is one component of a comprehensive MRSA colonization surveillance program. It is not intended to diagnose MRSA infection nor to guide or monitor treatment for MRSA infections.     Coagulation Studies: No results for input(s): LABPROT, INR in the last 72 hours.  Imaging: Ct Head Wo Contrast  Result Date: 08/03/2016 CLINICAL DATA:  Subdural hematoma follow-up.  Dementia. EXAM: CT HEAD WITHOUT CONTRAST TECHNIQUE: Contiguous axial images were obtained from the base of the skull through the vertex without intravenous contrast. COMPARISON:  08/02/2016 FINDINGS: Brain: Small volume subarachnoid hemorrhage anteriorly on the right unchanged. Right temporal subdural hematoma 8.3  mm, slightly improved from the prior study. Intraventricular hemorrhage unchanged. New new area of hemorrhage. Generalized atrophy with ventricular enlargement which is stable. Diffuse cerebral white matter hypodensity and basal ganglia chronic infarcts unchanged. Negative for acute infarct or mass. Vascular: Atherosclerotic calcification. Negative for hyperdense vessel Skull: Negative for fracture.  Right frontal scalp hematoma. Sinuses/Orbits: Small air-fluid level sphenoid sinus unchanged. Remaining  sinuses clear Other: None IMPRESSION: Right temporal lobe subdural hematoma 8.3 mm appears slightly smaller. Mild subarachnoid hemorrhage stable. Intraventricular hemorrhage stable. No new area of hemorrhage since yesterday. Electronically Signed   By: Marlan Palau M.D.   On: 08/03/2016 09:31   Ct Head Wo Contrast  Result Date: 08/02/2016 CLINICAL DATA:  Fall yesterday with head injury. Follow-up subdural hematoma. EXAM: CT HEAD WITHOUT CONTRAST TECHNIQUE: Contiguous axial images were obtained from the base of the skull through the vertex without intravenous contrast. COMPARISON:  CT head 08/01/2016. FINDINGS: Brain: Subdural hematoma adjacent to the right temporal lobe has mildly enlarged, measuring up to 10 mm in thickness on image number 9 (previously 7 mm). Cortical contusions or subarachnoid hemorrhage along both frontal lobes is not significantly changed. There is slightly greater layering blood within both occipital horns. Ventriculomegaly, periventricular low-density and other scattered chronic small vessel ischemic changes are stable. There is no midline shift or evidence of acute stroke. Vascular: Diffuse intracranial vascular calcifications. Skull: No evidence of calvarial fracture. There is right frontal scalp and right periorbital soft tissue swelling. Sinuses/Orbits: The visualized paranasal sinuses, mastoid air cells and middle ears are clear. No acute orbital findings. There is periorbital soft tissue swelling on the right. Postsurgical changes are present in both globes. Other: None. IMPRESSION: 1. Right temporal subdural hematoma has mildly enlarged from yesterday. 2. Mildly progressive intraventricular hemorrhage. Stable ventriculomegaly. 3. No midline shift or other significant changes. Electronically Signed   By: Carey Bullocks M.D.   On: 08/02/2016 11:23    Medications:  I have reviewed the patient's current medications. Scheduled: . citalopram  40 mg Oral Daily  . donepezil  10  mg Oral Daily  . famotidine  20 mg Oral Daily  . lactase  6,000 Units Oral TID WC  . lisinopril  10 mg Oral QHS  . memantine  14 mg Oral Daily  . metoprolol succinate  50 mg Oral Daily  . multivitamin with minerals  1 tablet Oral Daily  . Vitamin D (Ergocalciferol)  50,000 Units Oral Weekly    Assessment/Plan: Agree with current management.  No further imaging indicated at this time.   Consider d/c planning.    08/04/2016  10:54 AM

## 2016-08-05 LAB — URINE CULTURE: Culture: <10000 — AB

## 2017-02-26 ENCOUNTER — Telehealth: Payer: Self-pay

## 2017-02-26 NOTE — Telephone Encounter (Signed)
PLEASE NOTE: All timestamps contained within this report are represented as [place] Time. CONFIDENTIALTY NOTICE: This fax transmission is intended only for the addressee. It contains information that is legally privileged, confidential or otherwise protected from use or disclosure. If you are not the intended recipient, you are strictly prohibited from reviewing, disclosing, copying using or disseminating any of this information or taking any action in reliance on or regarding this information. If you have received this fax in error, please notify us immediately by telephone so that we can arrange for its return to us. Phone: [phone], Toll-Free: [phone], Fax: [phone] Page: 1 of 2 Call Id: [id number] [facility] Client TELEPHONE ADVICE RECORD [facility] Patient Name: Marissa FlakesMIDLRED Colon Gender: Female DOB: Mar 21, 1923 Age: [age] Y 4 M 2 D Return Phone Number: Address: City/State/Zip: [place] Client [facility] Physician Tillman AbideLetvak, Richard - MD Contact Type Call Who Is Calling Nursing Home / Skilled Nursing Facility Call Type Triage / Clinical Caller Name Debo Return Phone Number Please choose phone number [facility] Facility Number [phone] Chief Complaint Paging or Request for Consult Reason for Call Report lab results Initial Comment [facility] and needs to give a report for Urinary sample Additional Comment Translation No Nurse Assessment Nurse: Elroy ChannelIrvin, RN, Rosey Batheresa Date/Time Lamount Cohen(Eastern Time): 02/25/2017 12:24:13 PM Confirm and document reason for call. If symptomatic, describe symptoms. ---[facility] results. Showed many calcium oxalate crystals Does the patient have any new or worsening symptoms? ---No Guidelines Guideline Title Affirmed Question Affirmed Notes Nurse Date/Time  (Eastern Time) Disp. Time Lamount Cohen(Eastern Time) Disposition Final User 02/25/2017 12:26:44 PM [facility] Irvin, [place]RN, Rosey Batheresa 02/25/2017 1:03:02 PM [facility]vin, RN, Rosey Batheresa 02/25/2017 1:09:41 PM Clinical Call Yes Elroy ChannelIrvin, RN, Rosey Batheresa Comments User: Criss Rosaleseresa, Irvin, RN Date/Time Lamount Cohen(Eastern Time): 02/25/2017 1:09:16 PM Informed [facility] no new orders PLEASE NOTE: All timestamps contained within this report are represented as [place] Standard Time. CONFIDENTIALTY NOTICE: This fax transmission is intended only for the addressee. It contains information that is legally privileged, confidential or otherwise protected from use or disclosure. If you are not the intended recipient, you are strictly prohibited from reviewing, disclosing, copying using or disseminating any of this information or taking any action in reliance on or regarding this information. If you have received this fax in error, please notify us immediately by telephone so that we can arrange for its return to us. Phone: [phone], Toll-Free: [phone], Fax: [phone] Page: 2 of 2 Call Id: [id number] Paging DoctorName Phone DateTime Result/Outcome Message Type Notes Santiago BumpersMcGowen, Phil - MD [phone] 02/25/2017 12:26:44 PM Paged On Call Back to Call Center Doctor Paged [facility] reporting UA result. Lowella Dandyeresa [facility] [phone] Santiago BumpersMcGowen, Phil - MD [phone] 02/25/2017 1:03:02 PM Paged On Call Back to Call Center Doctor Paged Please call Lowella Dandyeresa [facility] [phone] Santiago BumpersMcGowen, Phil - MD 02/25/2017 1:08:23 PM Spoke with On Call - General Message Result Gave report to MD. No new orders

## 2017-02-26 NOTE — Telephone Encounter (Signed)
PLEASE NOTE: All timestamps contained within this report are represented as [place] Standard Time. CONFIDENTIALTY NOTICE: This fax transmission is intended only for the addressee. It contains information that is legally privileged, confidential or otherwise protected from use or disclosure. If you are not the intended recipient, you are strictly prohibited from reviewing, disclosing, copying using or disseminating any of this information or taking any action in reliance on or regarding this information. If you have received this fax in error, please notify us immediately by telephone so that we can arrange for its return to us. Phone: [phone], Toll-Free: [phone], Fax: [phone] Page: 1 of 1 Call Id: [id number] [facility] - Client Nonclinical Telephone Record [facility] Client [facility] - [facility] [place] Contact Type Call Who Is Calling Lab Lab Name [facility] Phone Number [phone] Lab Tech Name Debo Lab Reference Number Patient Name Marissa BlazingMildred Colon Patient DOB May 03, 1923 Call Type Lab Message Only Reason for Call Report lab results Initial Comment Caller states that uacna obtained three days ago. Additional Comment Call Closed By: Tamsen MeekMegan Myrick Transaction Date/Time: 02/24/2017 9:08:35 PM (ET)

## 2017-02-26 NOTE — Telephone Encounter (Signed)
This is not my patient (one of the very few at [facility]), so this call should not have come to us. I called the DON to look into making sure the attending is clear on these other residents who I don't see

## 2018-02-11 IMAGING — CT CT CERVICAL SPINE W/O CM
3 of 6 series · 11 of 27 positions shown, 12 images · non-contrast
Comparison: None.

CLINICAL DATA: Fall

EXAM:
CT CERVICAL SPINE WITHOUT CONTRAST
TECHNIQUE: Multidetector CT imaging of the cervical spine was performed without
intravenous contrast. Multiplanar CT image reconstructions were also
generated.

[Series 4: c spine soft · axial · 0.41mm/px · z∈[-318,-242]mm · 3 of 78 slices shown]
[im 20/78  soft-tissue]
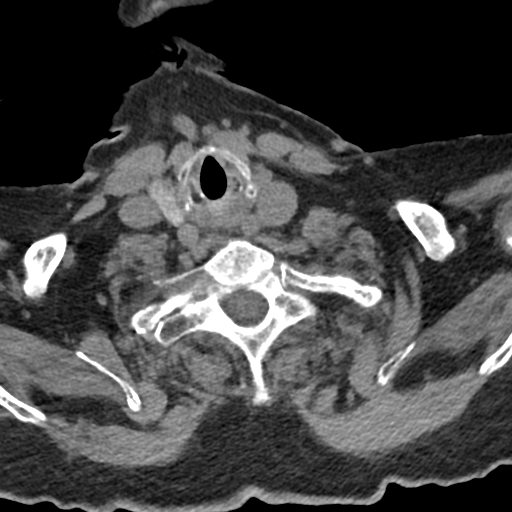
[im 39/78  soft-tissue]
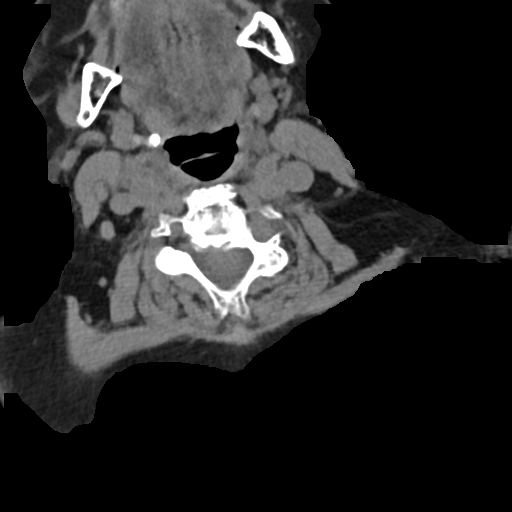
[im 58/78  soft-tissue]
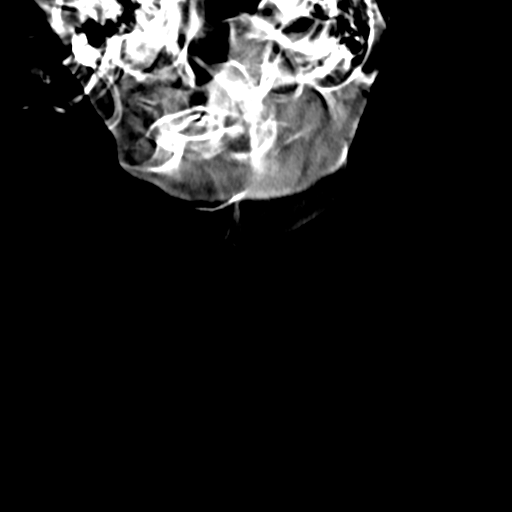

[Series 10: orthogonal bone · axial · 0.25mm/px · z∈[-382,-274]mm · 3 of 68 slices shown, 4 images]
[im 1/68  soft-tissue]
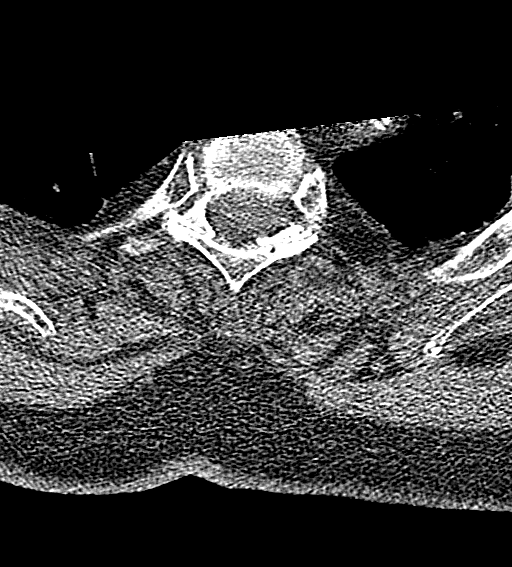
[im 1/68  bone]
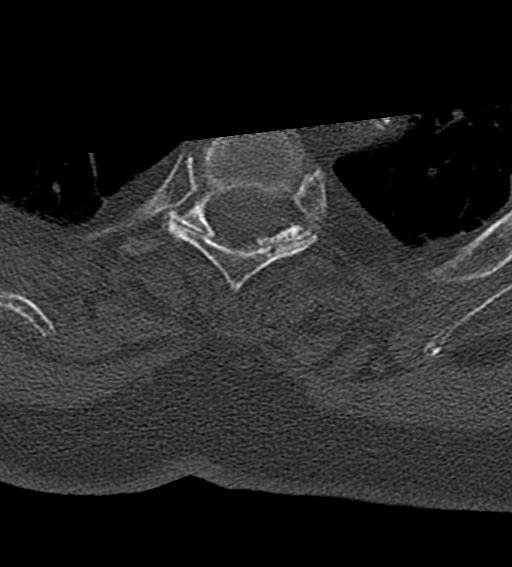
[im 34/68  bone]
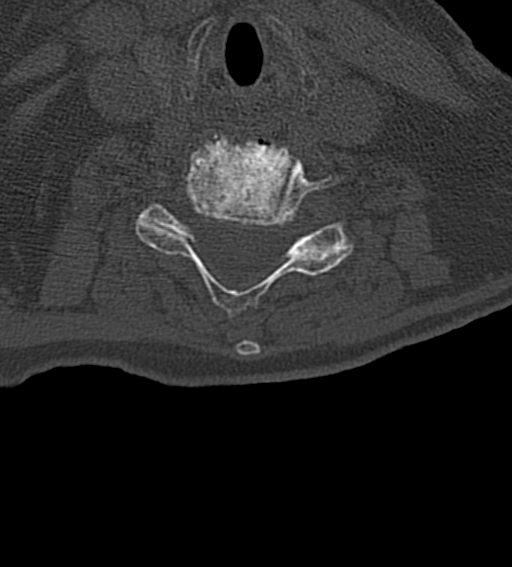
[im 68/68  bone]
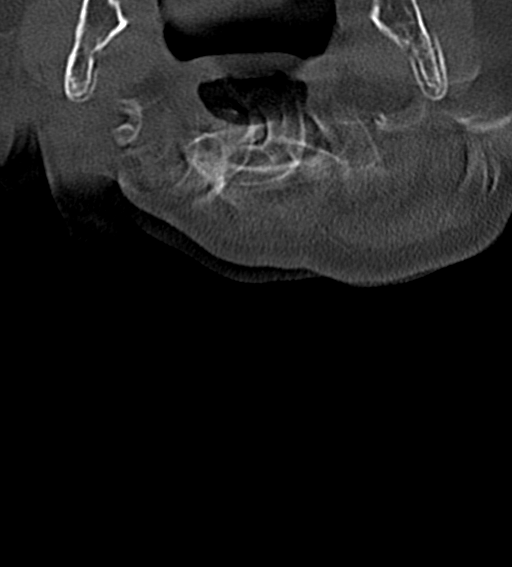

[Series 13: sagittal bone · sagittal · 0.23mm/px · 5 of 51 slices shown]
[im 9/51  bone]
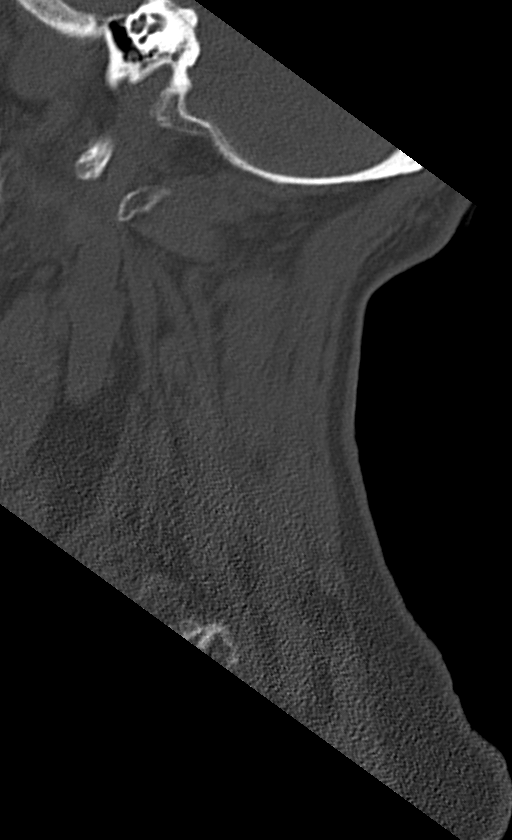
[im 17/51  bone]
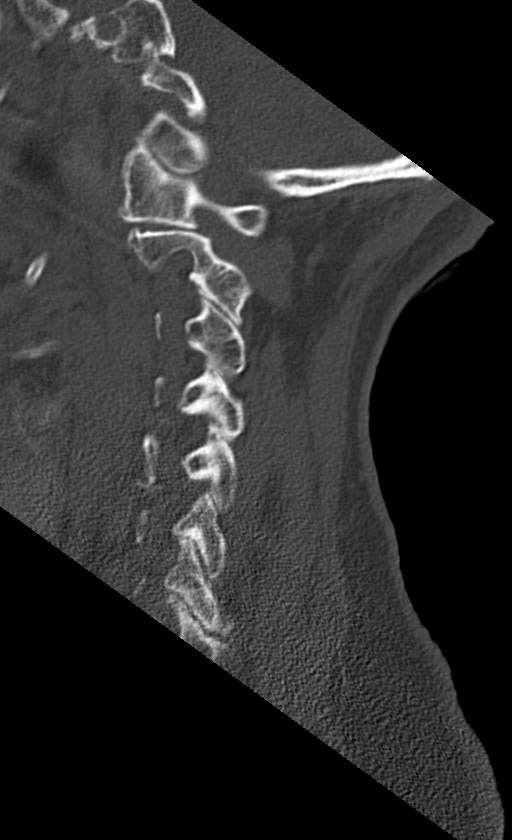
[im 26/51  bone]
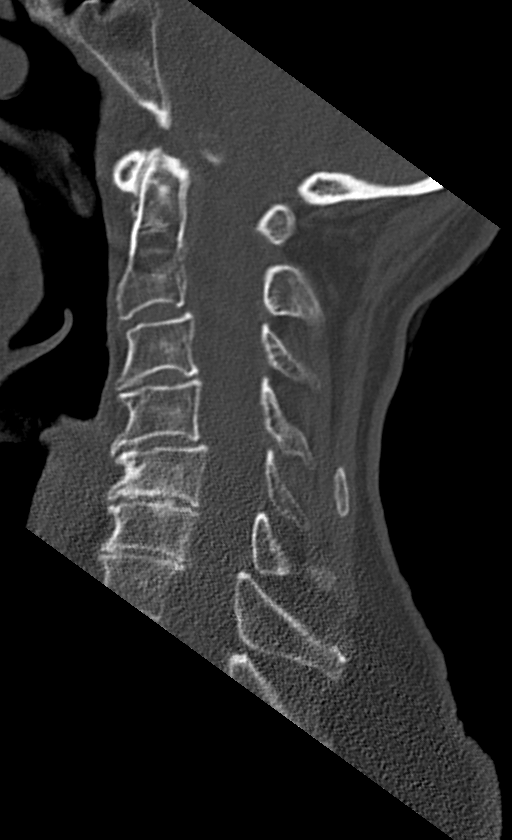
[im 34/51  bone]
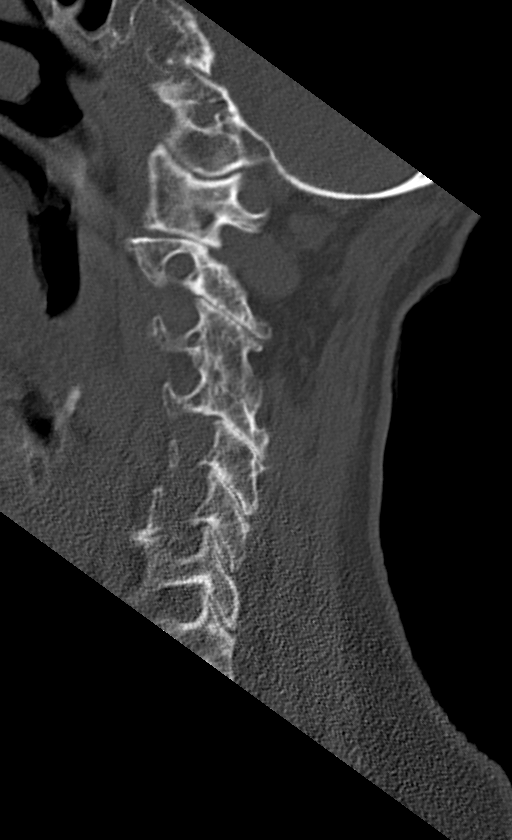
[im 42/51  bone]
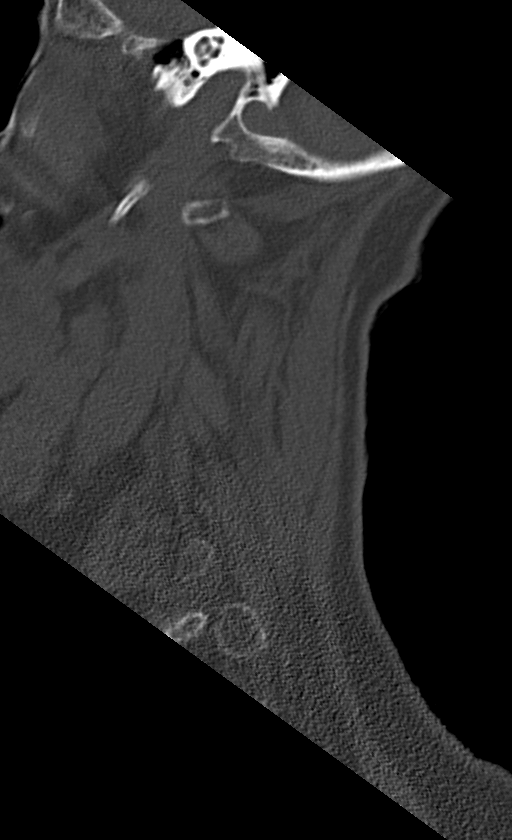

[11 of 27 positions shown; findings below may reference images not displayed]

FINDINGS: Alignment: No static subluxation. Facets are aligned. Occipital
condyles and the lateral masses of C1 and C2 are normally
approximated.

Skull base and vertebrae: No acute fracture.

Soft tissues and spinal canal: No prevertebral fluid or swelling. No
visible canal hematoma.

Disc levels: There is multilevel severe facet hypertrophy and disc
degeneration with uncovertebral hypertrophy. The left C3-C4 facets
are fused. There is severe stenosis of the right C6-7 neural
foramen. No bony spinal canal stenosis.

Upper chest: No pneumothorax, pulmonary nodule or pleural effusion.
There is aortic atherosclerosis.

Other: Normal visualized paraspinal cervical soft tissues.
IMPRESSION: 1. No acute fracture or static subluxation of the cervical spine.
2. Degenerative disease with severe stenosis of the right C6-7
neural foramen.
3.  Aortic Atherosclerosis ([id number]).

## 2020-05-07 DEATH — deceased
